# Patient Record
Sex: Male | Born: 1965 | Race: Black or African American | Hispanic: No | Marital: Single | Smoking: Never smoker
Health system: Southern US, Community
[De-identification: ages and names within clinical notes are randomized; demographics above are authoritative.]

## PROBLEM LIST (undated history)

## (undated) DIAGNOSIS — I10 Essential (primary) hypertension: Secondary | ICD-10-CM

## (undated) DIAGNOSIS — R569 Unspecified convulsions: Secondary | ICD-10-CM

## (undated) DIAGNOSIS — F849 Pervasive developmental disorder, unspecified: Secondary | ICD-10-CM

## (undated) DIAGNOSIS — K5900 Constipation, unspecified: Secondary | ICD-10-CM

## (undated) DIAGNOSIS — F809 Developmental disorder of speech and language, unspecified: Secondary | ICD-10-CM

## (undated) HISTORY — PX: DG TEETH FULL: HXRAD118

---

## 2011-09-30 HISTORY — PX: CATARACT EXTRACTION W/ INTRAOCULAR LENS IMPLANT: SHX1309

## 2014-06-15 ENCOUNTER — Emergency Department: Payer: Self-pay | Admitting: Emergency Medicine

## 2014-06-15 LAB — URINALYSIS, COMPLETE
BACTERIA: NONE SEEN
Bilirubin,UR: NEGATIVE
Blood: NEGATIVE
Glucose,UR: NEGATIVE mg/dL (ref 0–75)
Ketone: NEGATIVE
Leukocyte Esterase: NEGATIVE
NITRITE: NEGATIVE
Ph: 7 (ref 4.5–8.0)
Protein: NEGATIVE
RBC,UR: 3 /HPF (ref 0–5)
SPECIFIC GRAVITY: 1.014 (ref 1.003–1.030)
SQUAMOUS EPITHELIAL: NONE SEEN
WBC UR: 4 /HPF (ref 0–5)

## 2014-06-15 LAB — COMPREHENSIVE METABOLIC PANEL
AST: 10 U/L — AB (ref 15–37)
Albumin: 3.7 g/dL (ref 3.4–5.0)
Alkaline Phosphatase: 82 U/L
Anion Gap: 18 — ABNORMAL HIGH (ref 7–16)
BILIRUBIN TOTAL: 0.3 mg/dL (ref 0.2–1.0)
BUN: 17 mg/dL (ref 7–18)
CO2: 17 mmol/L — AB (ref 21–32)
Calcium, Total: 8.4 mg/dL — ABNORMAL LOW (ref 8.5–10.1)
Chloride: 104 mmol/L (ref 98–107)
Creatinine: 1.86 mg/dL — ABNORMAL HIGH (ref 0.60–1.30)
EGFR (African American): 49 — ABNORMAL LOW
GFR CALC NON AF AMER: 42 — AB
Glucose: 169 mg/dL — ABNORMAL HIGH (ref 65–99)
Osmolality: 283 (ref 275–301)
POTASSIUM: 4.2 mmol/L (ref 3.5–5.1)
SGPT (ALT): 21 U/L
Sodium: 139 mmol/L (ref 136–145)
TOTAL PROTEIN: 7.8 g/dL (ref 6.4–8.2)

## 2014-06-15 LAB — TROPONIN I

## 2014-06-15 LAB — CBC WITH DIFFERENTIAL/PLATELET
BASOS ABS: 0.1 10*3/uL (ref 0.0–0.1)
Basophil %: 0.6 %
Eosinophil #: 0.6 10*3/uL (ref 0.0–0.7)
Eosinophil %: 5.5 %
HCT: 43.9 % (ref 40.0–52.0)
HGB: 13.8 g/dL (ref 13.0–18.0)
Lymphocyte #: 2.9 10*3/uL (ref 1.0–3.6)
Lymphocyte %: 28.4 %
MCH: 28.7 pg (ref 26.0–34.0)
MCHC: 31.4 g/dL — AB (ref 32.0–36.0)
MCV: 91 fL (ref 80–100)
Monocyte #: 1 x10 3/mm (ref 0.2–1.0)
Monocyte %: 10.1 %
Neutrophil #: 5.7 10*3/uL (ref 1.4–6.5)
Neutrophil %: 55.4 %
Platelet: 168 10*3/uL (ref 150–440)
RBC: 4.81 10*6/uL (ref 4.40–5.90)
RDW: 14.5 % (ref 11.5–14.5)
WBC: 10.4 10*3/uL (ref 3.8–10.6)

## 2014-06-15 LAB — TSH: Thyroid Stimulating Horm: 5.82 u[IU]/mL — ABNORMAL HIGH

## 2014-06-15 LAB — LIPASE, BLOOD: LIPASE: 77 U/L (ref 73–393)

## 2014-11-21 ENCOUNTER — Ambulatory Visit: Payer: Self-pay | Admitting: Ophthalmology

## 2015-01-28 NOTE — Op Note (Signed)
PATIENT NAME:  Mario Mason, [name] MR#:  [id number] DATE OF BIRTH:  [birth year]  DATE OF PROCEDURE:  11/21/2014  PREOPERATIVE DIAGNOSIS: Mature cataract of the left eye.   POSTOPERATIVE DIAGNOSIS: Mature cataract of the left eye.  ANESTHESIA: General anesthesia by laryngeal mask.   SURGEON: Jerilee FieldWilliam L. Timberlyn Pickford, MD   COMPLICATIONS: None.   The patient was examined and consented in the preoperative holding area per his power of attorney, who is his sister. The patient was then premedicated in the preoperative holding area and taken back to the operating room, where the anesthesia team employed general anesthesia by laryngeal mask. Once the patient was asleep and comfortable, 10% phenylephrine was placed in the left eye to achieve rapid dilation. Following the AK-Dilate and compounded gel consisting of a gatifloxacin, phenylephrine, cyclopentolate, and lidocaine was placed on the eye in the usual topical fashion. After somewhat greater than 15 minutes, adequate dilation was achieved. The eye was prepped and draped in the usual sterile ophthalmic fashion and a lid speculum was placed. A paracentesis was created and epi-Shugarcaine was placed viscoelastic and the Tecnis ZCB00 sugar cannula was placed within the anterior chamber. A sterile air bubble was placed in the anterior chamber and vision blue dye was used to stain the anterior capsule. Viscoelastic was used to flush the vision blue and remaining epi-Shugarcaine from the anterior chamber. The keratome used to create a standard clear corneal incision. A continuous curvilinear capsulorrhexis was performed with a cystotome followed by the capsulorrhexis forceps. Hydrodissection was carried out with BSS on blunt cannula. The lens was removed in a stop and chop technique with phacoemulsification. Remaining cortical material was removed with the I and A handpiece. The capsular bag was inflated with DisCoVisc, and the Alcon SN60WF 20.0 diopter lens, serial number  [id number] was placed the capsular bag without complication. The remaining viscoelastic was removed from the eye. The wounds were hydrated. The eye was found to be watertight, 0.1 mL of cefuroxime was placed within the anterior chamber in the usual fashion. Additionally, 0.1 mL of Kenalog was placed beneath the conjunctiva as a steroid depot. Also, undiluted Vigamox was placed in a similar fashion as a conjunctival antibiotic depot. These were placed, as the patient will not tolerate any eyedrops postoperatively due to his severe cerebral palsy. The lid speculum was removed, Vigamox was placed topically on the eye, and a shield was placed over the eye. The patient was awakened from general anesthesia without complication. He will follow up with me in one day.    ____________________________ Jerilee FieldWilliam L. Chelsye Suhre, MD wlp:mw D: 11/21/2014 20:14:47 ET T: 11/22/2014 05:43:35 ET JOB#: [id number]  cc: Ahsley Attwood L. Neesha Langton, MD, <Dictator> Jerilee FieldWILLIAM L Marland Reine MD ELECTRONICALLY SIGNED 11/23/2014 10:31

## 2015-02-25 IMAGING — CT CT ABD-PELV W/O CM
2 of 4 series · 16 of 46 positions shown, 18 images · non-contrast
Comparison: Chest x-ray of today's date.

CLINICAL DATA: Vomiting and possible seizure activity

EXAM:
CT ABDOMEN AND PELVIS WITHOUT CONTRAST
TECHNIQUE: Multidetector CT imaging of the abdomen and pelvis was performed
following the standard protocol without IV contrast. The patient
could not tolerate oral contrast material.

[Series 2: routine abd pel without · axial · non-contrast · 0.68mm/px · z∈[-852,-418]mm · 13 of 95 slices shown, 15 images]
[im 4/95  soft-tissue]
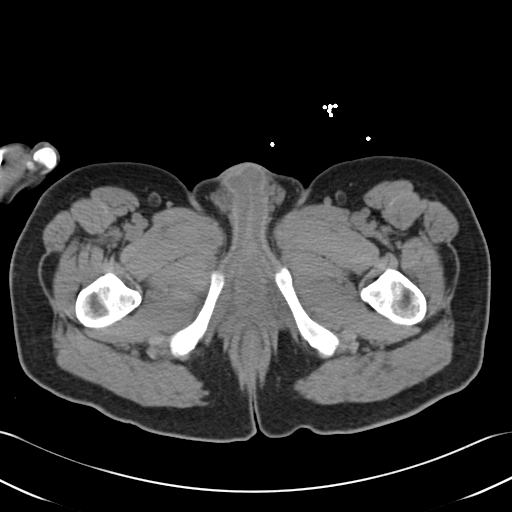
[im 4/95  bone]
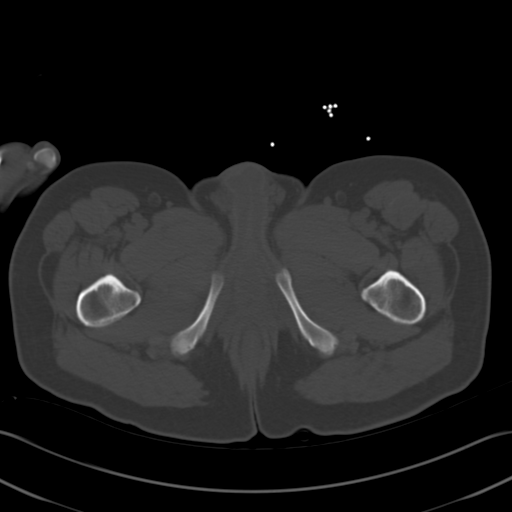
[im 11/95  soft-tissue]
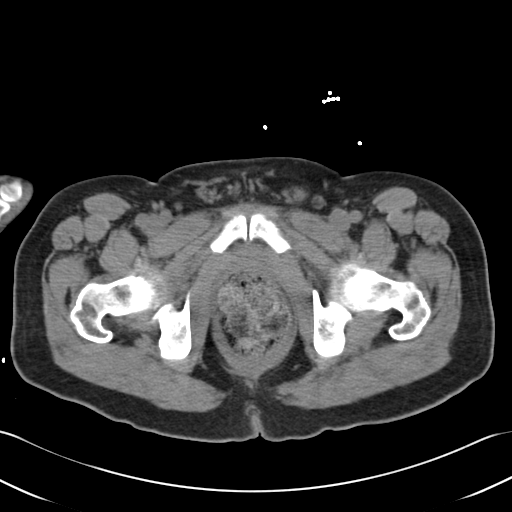
[im 19/95  soft-tissue]
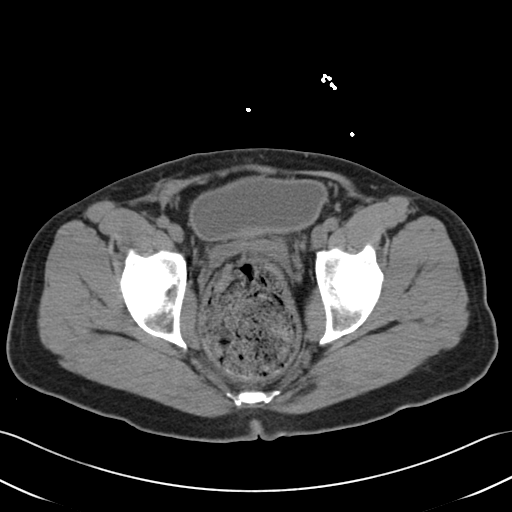
[im 26/95  soft-tissue]
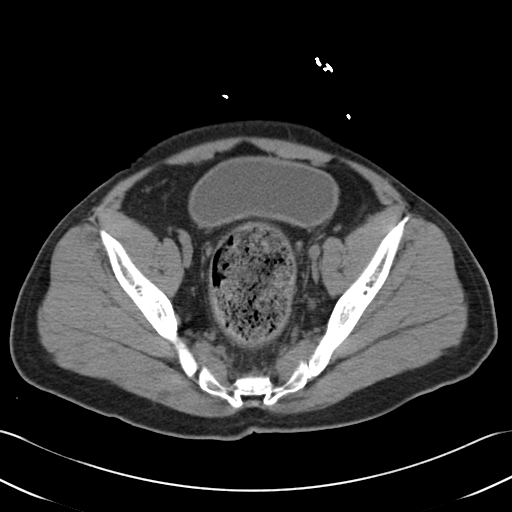
[im 33/95  soft-tissue]
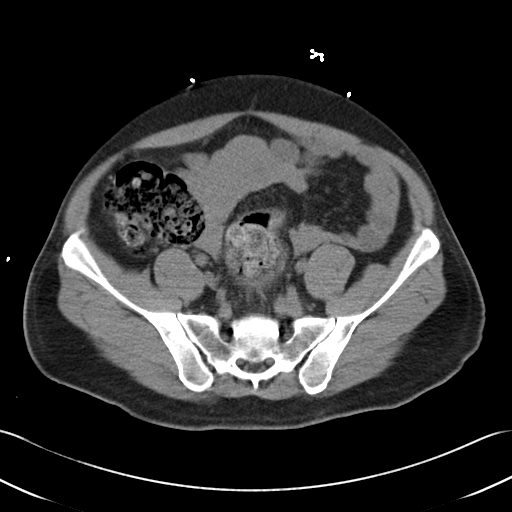
[im 40/95  soft-tissue]
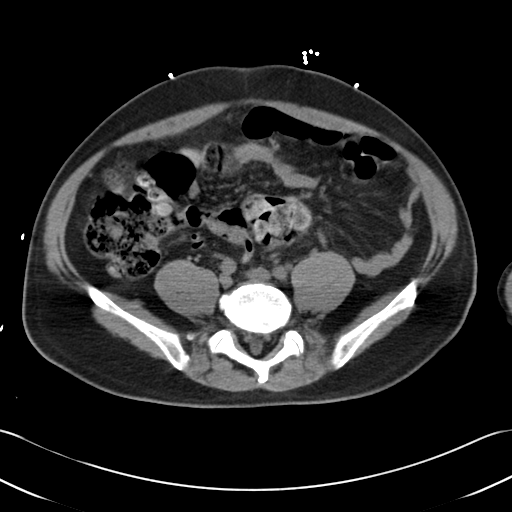
[im 48/95  soft-tissue]
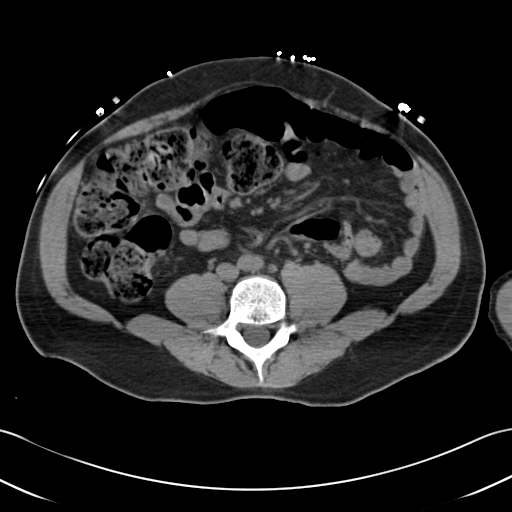
[im 55/95  soft-tissue]
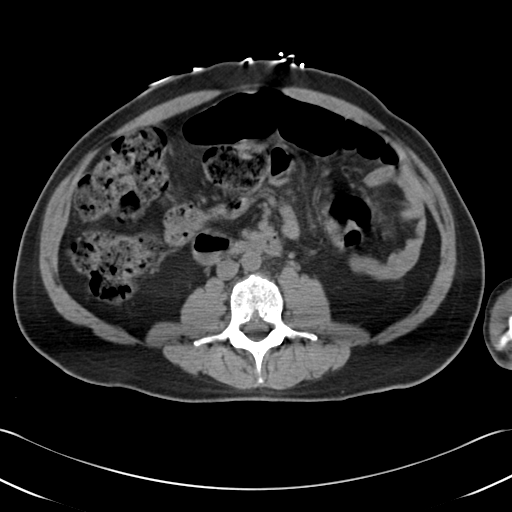
[im 62/95  soft-tissue]
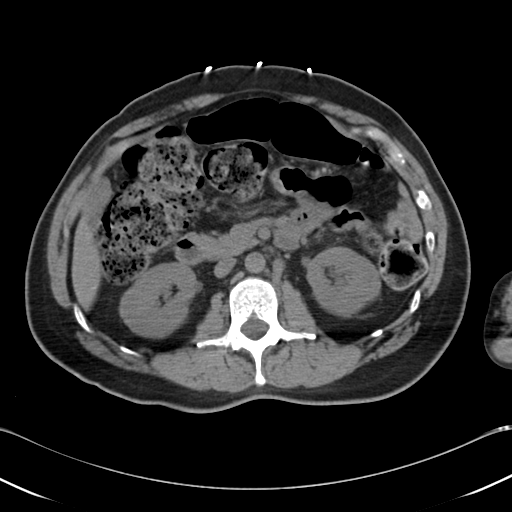
[im 62/95  bone]
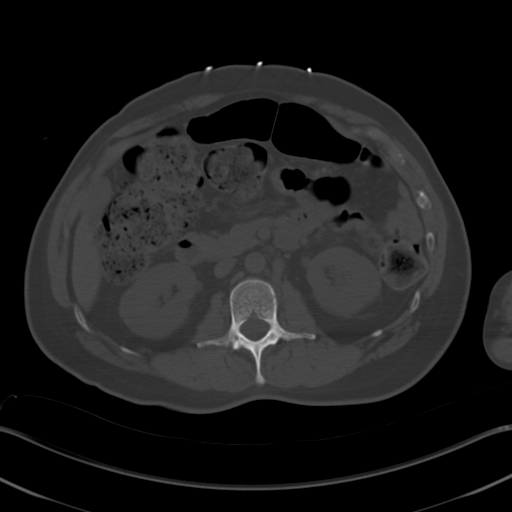
[im 69/95  soft-tissue]
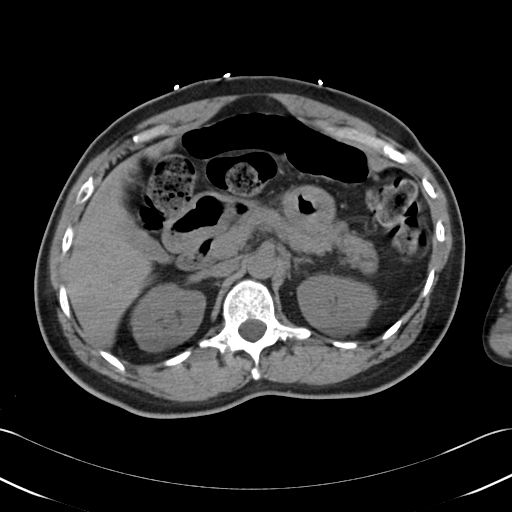
[im 76/95  soft-tissue]
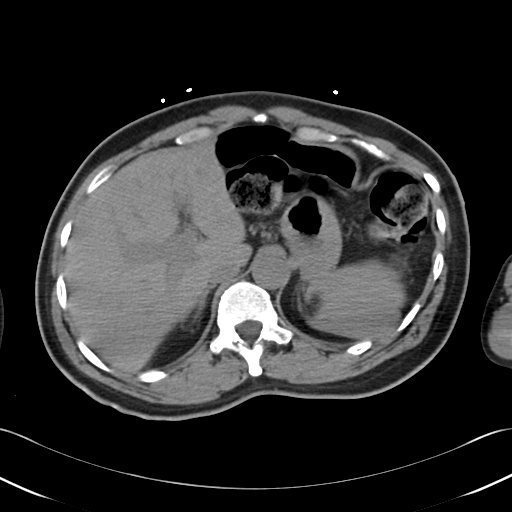
[im 84/95  soft-tissue]
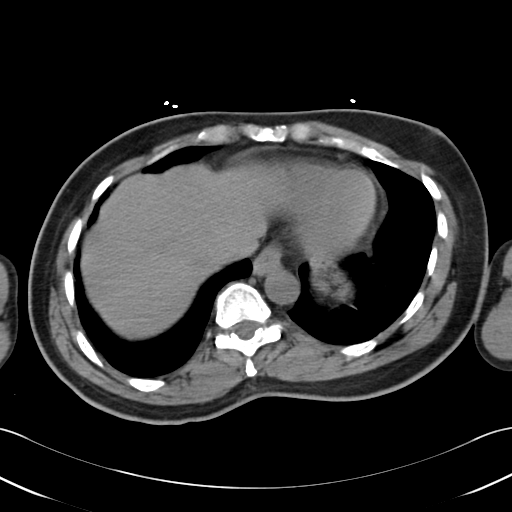
[im 91/95  soft-tissue]
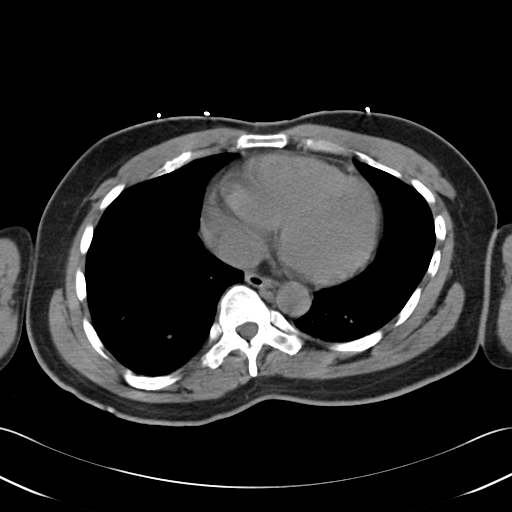

[Series 5: cor routine abd pel wo · coronal · 0.73mm/px · 3 of 119 slices shown]
[im 40/119  soft-tissue]
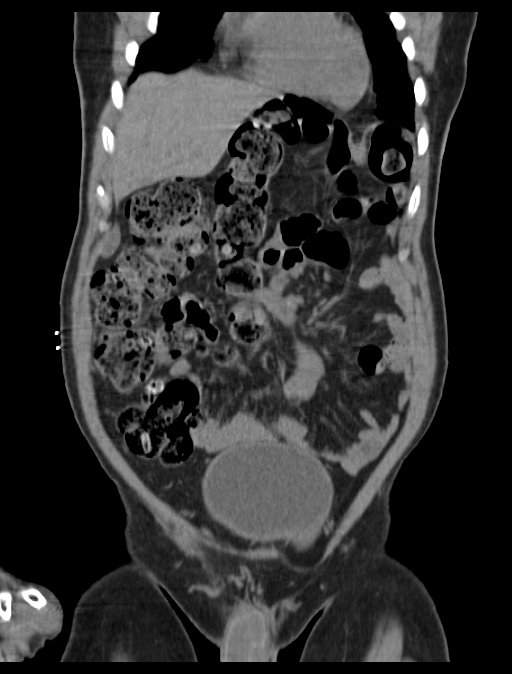
[im 53/119  soft-tissue]
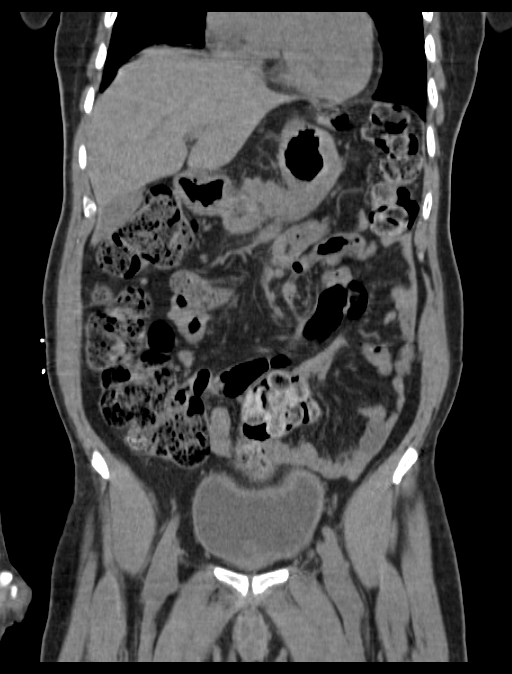
[im 66/119  soft-tissue]
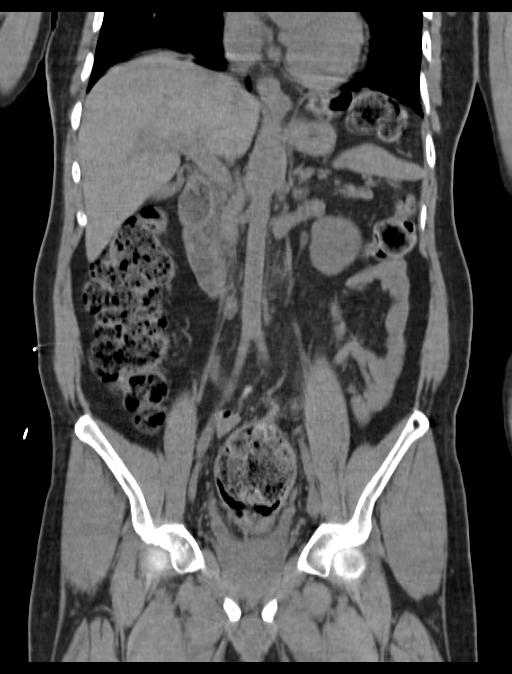

[16 of 46 positions shown; findings below may reference images not displayed]

FINDINGS: The liver, gallbladder, pancreas, spleen, adrenal glands, and
kidneys are normal. Small hepatic cysts are suspected. The left
hemidiaphragm does not appear abnormally elevated on this study. The
caliber of the abdominal aorta is normal. There is a normal
appearance of the stomach and small bowel. The appendix is normal.
There is increased stool burden within the colon. A large amount of
stool is present in the rectum consistent with an impaction. The
urinary bladder and prostate gland are grossly normal. There is no
ascites or lymphadenopathy.

The lung bases are clear. The lumbar spine and bony pelvis exhibit
no acute abnormalities.
IMPRESSION: 1. There is an increased stool burden throughout the colon
consistent with constipation. There is likely a fecal impaction
within the rectum. The small bowel is unremarkable.
2. There is no acute hepatobiliary nor urinary tract abnormality.

## 2016-08-09 ENCOUNTER — Emergency Department
Admission: EM | Admit: 2016-08-09 | Discharge: 2016-08-09 | Disposition: A | Discharge status: Home / Self Care | Payer: Medicare Other | Attending: Emergency Medicine | Admitting: Emergency Medicine

## 2016-08-09 DIAGNOSIS — Y929 Unspecified place or not applicable: Secondary | ICD-10-CM | POA: Insufficient documentation

## 2016-08-09 DIAGNOSIS — Y9389 Activity, other specified: Secondary | ICD-10-CM | POA: Insufficient documentation

## 2016-08-09 DIAGNOSIS — W01118A Fall on same level from slipping, tripping and stumbling with subsequent striking against other sharp object, initial encounter: Secondary | ICD-10-CM | POA: Insufficient documentation

## 2016-08-09 DIAGNOSIS — Y999 Unspecified external cause status: Secondary | ICD-10-CM | POA: Diagnosis not present

## 2016-08-09 DIAGNOSIS — S01111A Laceration without foreign body of right eyelid and periocular area, initial encounter: Secondary | ICD-10-CM | POA: Insufficient documentation

## 2016-08-09 DIAGNOSIS — S0181XA Laceration without foreign body of other part of head, initial encounter: Secondary | ICD-10-CM

## 2016-08-09 DIAGNOSIS — S0990XA Unspecified injury of head, initial encounter: Secondary | ICD-10-CM | POA: Diagnosis present

## 2016-08-09 NOTE — ED Provider Notes (Signed)
[facility] Emergency Department Provider Note  ____________________________________________  Time seen: Approximately 8:13 PM  I have reviewed the triage vital signs and the nursing notes.   HISTORY  Chief Complaint Laceration    HPI Mario Mason is a 50 y.o. male who presents to the emergency department accompanied by his group home care giver for evaluation for a laceration. At baseline patient is nonverbal and history was obtained from caregiver. He has a history of autism and is in a group home. The caregiver reports that earlier this evening the patient was attempting to get away from caregiver when he fell and hit his head on the door nob causing the laceration. She reports that previously, he was using the restroom when he soiled himself. Injury occurred while he was attempting to evade group home personnel. She denies any loss of consciousness, vomiting, or changes in behavior or activity level. Since injury, patient has been acting his normal self with no changes. He is up to date on tetanus.    No past medical history on file.  There are no active problems to display for this patient.   No past surgical history on file.  Prior to Admission medications   Not on File    Allergies Patient has no known allergies.  No family history on file.  Social History Social History  Substance Use Topics  . Smoking status: Not on file  . Smokeless tobacco: Not on file  . Alcohol use Not on file     Review of Systems  Eyes: No visual changes.  Gastrointestinal:  No nausea, no vomiting.  Musculoskeletal: Patient flinches with palpation of the forehead at area of laceration. Skin: Positive for laceration to right eyebrow.  Neurological: No loss of consciousness. Patient is at baseline for activity and behavior. 10-point ROS otherwise negative.  ____________________________________________   PHYSICAL EXAM:  VITAL SIGNS: ED Triage Vitals  Enc  Vitals Group     BP 08/09/16 1905 (!) 133/94     Pulse Rate 08/09/16 1905 82     Resp 08/09/16 1905 18     Temp 08/09/16 1905 98 F (36.7 C)     Temp Source 08/09/16 1905 Oral     SpO2 08/09/16 1905 94 %     Weight 08/09/16 1906 115 lb (52.2 kg)     Height 08/09/16 1906 5\' 11"  (1.803 m)     Head Circumference --      Peak Flow --      Pain Score --      Pain Loc --      Pain Edu? --      Excl. in GC? --      Constitutional: Alert and oriented. Well appearing and in no acute distress. Eyes: Conjunctivae are normal. PERRL. EOMI. Head: Laceration to right eyebrow See below for description of laceration. Patient otherwise does not withdraw to palpation of the osseous structures of the skull or face. No battle signs. No raccoon eyes. No serous fluid drainage from the ears or nares. Neck: No cervical spine tenderness to palpation. Cardiovascular: Normal rate, regular rhythm. Normal S1 and S2.  Good peripheral circulation. Respiratory: Normal respiratory effort without tachypnea or retractions. Lungs CTAB. Good air entry to the bases with no decreased or absent breath sounds. Musculoskeletal: Full range of motion to all extremities. No gross deformities appreciated. Neurologic:  Normal speech and language. No gross focal neurologic deficits are appreciated.  Skin: 1.5 cm superficial laceration to the lateral portion of the  right eyebrow. No foreign bodies noted. No erythema, ecchymosis, streaking, oozing, or active bleeding at this time.  Psychiatric: Mood and affect are normal. Speech and behavior are normal. Patient exhibits appropriate insight and judgement.   ____________________________________________   LABS (all labs ordered are listed, but only abnormal results are displayed)  Labs Reviewed - No data to display ____________________________________________  EKG  None ____________________________________________  RADIOLOGY  No results  found.  ____________________________________________    PROCEDURES  Procedure(s) performed:    Marland Kitchen.Marland Kitchen.Laceration Repair Date/Time: 08/09/2016 8:36 PM Performed by: Gala RomneyUTHRIELL, JONATHAN D Authorized by: Gala RomneyUTHRIELL, JONATHAN D   Consent:    Consent obtained:  Verbal   Consent given by:  Healthcare agent   Risks discussed:  Poor cosmetic result, pain and infection   Alternatives discussed:  No treatment Anesthesia (see MAR for exact dosages):    Anesthesia method:  None Laceration details:    Location:  Face   Face location:  R eyebrow   Length (cm):  1.5 Repair type:    Repair type:  Simple Exploration:    Hemostasis achieved with:  Direct pressure   Wound exploration: wound explored through full range of motion     Wound extent: no foreign bodies/material noted, no muscle damage noted and no underlying fracture noted   Treatment:    Area cleansed with:  Hibiclens   Amount of cleaning:  Standard Skin repair:    Repair method:  Tissue adhesive Approximation:    Approximation:  Close Post-procedure details:    Dressing:  Open (no dressing)   Patient tolerance of procedure:  Tolerated well, no immediate complications      Medications - No data to display   ____________________________________________   INITIAL IMPRESSION / ASSESSMENT AND PLAN / ED COURSE  Pertinent labs & imaging results that were available during my care of the patient were reviewed by me and considered in my medical decision making (see chart for details).  Review of the [place] CSRS was performed in accordance of the [facility] prior to dispensing any controlled drugs.  Clinical Course     Patient's diagnosis is consistent with laceration of the right eyebrow. Wound was cleaned and re-approximated with Dermabond. Patient is acting at his baseline with no loss of consciousness during the injury. At this time, there is no indication for imaging for acute intracranial abnormality. Patient does have close  follow-up with medical staff within the group home. They're given instructions to return for any sudden changes from patient's baseline. Patient will be discharged home with wound care instructions. Patient is to follow up with his primary care as needed or otherwise directed. Patient is given ED precautions to return to the ED for any worsening or new symptoms.     ____________________________________________  FINAL CLINICAL IMPRESSION(S) / ED DIAGNOSES  Final diagnoses:  Laceration of forehead, initial encounter      NEW MEDICATIONS STARTED DURING THIS VISIT:  There are no discharge medications for this patient.       This chart was dictated using voice recognition software/Dragon. Despite best efforts to proofread, errors can occur which can change the meaning. Any change was purely unintentional.   Racheal PatchesJonathan D Cuthriell, PA-C 08/09/16 2123    Jene Everyobert Kinner, MD 08/09/16 2153

## 2016-08-09 NOTE — ED Triage Notes (Signed)
Tripped and fell one hour ago, small lac above R brow

## 2016-08-09 NOTE — ED Notes (Signed)
Laceration to right brow, about 1 inch, bleeding controlled at this time.  Caregiver states pt did not lose consciousness when he fell.

## 2018-01-07 ENCOUNTER — Other Ambulatory Visit: Payer: Self-pay | Admitting: Gastroenterology

## 2018-01-07 DIAGNOSIS — R634 Abnormal weight loss: Secondary | ICD-10-CM

## 2018-01-07 DIAGNOSIS — R1310 Dysphagia, unspecified: Secondary | ICD-10-CM

## 2018-01-11 ENCOUNTER — Ambulatory Visit
Admission: RE | Admit: 2018-01-11 | Discharge: 2018-01-11 | Disposition: A | Discharge status: Home / Self Care | Payer: Medicare Other | Source: Ambulatory Visit | Attending: Gastroenterology | Admitting: Gastroenterology

## 2018-01-11 DIAGNOSIS — R1310 Dysphagia, unspecified: Secondary | ICD-10-CM | POA: Diagnosis present

## 2018-01-11 DIAGNOSIS — R634 Abnormal weight loss: Secondary | ICD-10-CM | POA: Insufficient documentation

## 2018-02-02 ENCOUNTER — Encounter: Payer: Self-pay | Admitting: *Deleted

## 2018-02-03 ENCOUNTER — Ambulatory Visit: Payer: Medicare Other | Admitting: Anesthesiology

## 2018-02-03 ENCOUNTER — Encounter
Admission: RE | Disposition: A | Discharge status: Home / Self Care | Payer: Self-pay | Source: Ambulatory Visit | Attending: Internal Medicine

## 2018-02-03 ENCOUNTER — Ambulatory Visit
Admission: RE | Admit: 2018-02-03 | Discharge: 2018-02-03 | Disposition: A | Discharge status: Home / Self Care | Payer: Medicare Other | Source: Ambulatory Visit | Attending: Internal Medicine | Admitting: Internal Medicine

## 2018-02-03 DIAGNOSIS — R1310 Dysphagia, unspecified: Secondary | ICD-10-CM | POA: Diagnosis present

## 2018-02-03 DIAGNOSIS — K449 Diaphragmatic hernia without obstruction or gangrene: Secondary | ICD-10-CM | POA: Diagnosis not present

## 2018-02-03 DIAGNOSIS — F79 Unspecified intellectual disabilities: Secondary | ICD-10-CM | POA: Diagnosis not present

## 2018-02-03 DIAGNOSIS — I10 Essential (primary) hypertension: Secondary | ICD-10-CM | POA: Diagnosis not present

## 2018-02-03 DIAGNOSIS — R634 Abnormal weight loss: Secondary | ICD-10-CM | POA: Diagnosis not present

## 2018-02-03 DIAGNOSIS — Z79899 Other long term (current) drug therapy: Secondary | ICD-10-CM | POA: Diagnosis not present

## 2018-02-03 DIAGNOSIS — F849 Pervasive developmental disorder, unspecified: Secondary | ICD-10-CM | POA: Diagnosis not present

## 2018-02-03 DIAGNOSIS — R569 Unspecified convulsions: Secondary | ICD-10-CM | POA: Insufficient documentation

## 2018-02-03 HISTORY — DX: Pervasive developmental disorder, unspecified: F84.9

## 2018-02-03 HISTORY — PX: ESOPHAGOGASTRODUODENOSCOPY (EGD) WITH PROPOFOL: SHX5813

## 2018-02-03 HISTORY — DX: Essential (primary) hypertension: I10

## 2018-02-03 HISTORY — DX: Unspecified convulsions: R56.9

## 2018-02-03 HISTORY — DX: Constipation, unspecified: K59.00

## 2018-02-03 HISTORY — DX: Developmental disorder of speech and language, unspecified: F80.9

## 2018-02-03 SURGERY — ESOPHAGOGASTRODUODENOSCOPY (EGD) WITH PROPOFOL
Anesthesia: General

## 2018-02-03 MED ORDER — PROPOFOL 500 MG/50ML IV EMUL
INTRAVENOUS | Status: DC | PRN
  Administered 2018-02-03: 100 ug/kg/min via INTRAVENOUS

## 2018-02-03 MED ORDER — PROPOFOL 500 MG/50ML IV EMUL
INTRAVENOUS | Status: AC
  Filled 2018-02-03: qty 50

## 2018-02-03 MED ORDER — GLYCOPYRROLATE 0.2 MG/ML IJ SOLN
INTRAMUSCULAR | Status: DC | PRN
  Administered 2018-02-03: 0.4 mg via INTRAVENOUS

## 2018-02-03 MED ORDER — SODIUM CHLORIDE 0.9 % IV SOLN
INTRAVENOUS | Status: DC
  Administered 2018-02-03: 09:00:00 via INTRAVENOUS

## 2018-02-03 MED ORDER — KETAMINE HCL 50 MG/ML IJ SOLN
INTRAMUSCULAR | Status: AC
  Filled 2018-02-03: qty 10

## 2018-02-03 MED ORDER — KETAMINE HCL 50 MG/ML IJ SOLN
INTRAMUSCULAR | Status: DC | PRN
  Administered 2018-02-03: 175 mg via INTRAMUSCULAR

## 2018-02-03 NOTE — Anesthesia Post-op Follow-up Note (Signed)
Anesthesia QCDR form completed.        

## 2018-02-03 NOTE — Transfer of Care (Signed)
Immediate Anesthesia Transfer of Care Note  Patient: Mario Mason  Procedure(s) Performed: ESOPHAGOGASTRODUODENOSCOPY (EGD) WITH PROPOFOL (N/A )  Patient Location: PACU and Endoscopy Unit  Anesthesia Type:General  Level of Consciousness: drowsy  Airway & Oxygen Therapy: Patient Spontanous Breathing  Post-op Assessment: Report given to RN  Post vital signs: stable  Last Vitals:  Vitals Value Taken Time  BP    Temp    Pulse    Resp    SpO2      Last Pain: There were no vitals filed for this visit.       Complications: No apparent anesthesia complications

## 2018-02-03 NOTE — Interval H&P Note (Signed)
History and Physical Interval Note:  02/03/2018 8:59 AM  Mario Mason  has presented today for surgery, with the diagnosis of DYSPHAGIA WT LOSS  The various methods of treatment have been discussed with the patient and family. After consideration of risks, benefits and other options for treatment, the patient has consented to  Procedure(s): ESOPHAGOGASTRODUODENOSCOPY (EGD) WITH PROPOFOL (N/A) as a surgical intervention .  The patient's history has been reviewed, patient examined, no change in status, stable for surgery.  I have reviewed the patient's chart and labs.  Questions were answered to the patient's satisfaction.     [place], [place]

## 2018-02-03 NOTE — H&P (Signed)
Outpatient short stay form Pre-procedure 02/03/2018 8:55 AM Sherwin Hollingshed K. Norma Fredrickson, M.D.  Primary Physician: Einar Crow, M.D.  Reason for visit:  Dysphagia, weight loss  History of present illness: 52 year old male with a history of mental retardation is nonverbal.  He presented to Tawni Pummel, PA-C on 01/11/2018 with complaints of dysphagia by the caregiver.  He has had approximate 20 pound weight loss over the last few months.  Barium swallow done preprocedurally revealed no gross stricture ulceration or mass in the esophagus although the examination was limited due to patient agitation and motion.  No obvious reflux was noted nor was a hiatal hernia noted.    Current Facility-Administered Medications:  .  0.9 %  sodium chloride infusion, , Intravenous, Continuous, Sabryn Preslar, Boykin Nearing, MD  Medications Prior to Admission  Medication Sig Dispense Refill Last Dose  . Acetaminophen 325 MG CAPS Take 650 mg by mouth daily.     Marland Kitchen amantadine (SYMMETREL) 100 MG capsule Take 100 mg by mouth 2 (two) times daily.     . fluticasone (FLONASE) 50 MCG/ACT nasal spray Place 2 sprays into both nostrils as needed for allergies or rhinitis.     Marland Kitchen glycopyrrolate (ROBINUL) 1 MG tablet Take 1 mg by mouth 3 (three) times daily.     . mirtazapine (REMERON) 30 MG tablet Take 30 mg by mouth at bedtime.     . OXcarbazepine (TRILEPTAL) 300 MG/5ML suspension Take 300 mg by mouth 2 (two) times daily.     . polyethylene glycol (MIRALAX / GLYCOLAX) packet Take 17 g by mouth daily.        No Known Allergies   Past Medical History:  Diagnosis Date  . Constipation   . Hypertension   . 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 Seizures (HCC)     Review of systems:      Physical Exam  Gen: Alert, oriented. Appears stated age.  HEENT: [place]/AT. PERRLA. Lungs: CTA, no wheezes. CV: RR nl S1, S2. Abd: soft, benign, no masses. BS+ Ext: No edema. Pulses 2+    Planned  procedures: EGD.The patient's guardian understands the nature of the planned procedure, indications, risks, alternatives and potential complications including but not limited to bleeding, infection, perforation, damage to internal organs and possible oversedation/side effects from anesthesia. The responsible party agrees and gives consent to proceed.  Please refer to procedure notes for findings, recommendations and patient disposition/instructions.    Telena Peyser K. Norma Fredrickson, M.D. Gastroenterology 02/03/2018  8:55 AM

## 2018-02-03 NOTE — OR Nursing (Signed)
Dr. Pernell Dupre has spoken with pty and his sister, pt will have IV started in procedure room.

## 2018-02-03 NOTE — Anesthesia Preprocedure Evaluation (Signed)
Anesthesia Evaluation  Patient identified by MRN, date of birth, ID band Patient awake    Reviewed: Allergy & Precautions, H&P , NPO status , Patient's Chart, lab work & pertinent test results, reviewed documented beta blocker date and time   Airway Mallampati: II   Neck ROM: full    Dental  (+) Poor Dentition, Teeth Intact   Pulmonary neg pulmonary ROS,    Pulmonary exam normal        Cardiovascular Exercise Tolerance: Good hypertension, On Medications negative cardio ROS Normal cardiovascular exam Rhythm:regular Rate:Normal     Neuro/Psych Seizures -, Well Controlled,  negative neurological ROS  negative psych ROS   GI/Hepatic negative GI ROS, Neg liver ROS,   Endo/Other  negative endocrine ROS  Renal/GU negative Renal ROS  negative genitourinary   Musculoskeletal   Abdominal   Peds  Hematology negative hematology ROS (+)   Anesthesia Other Findings Past Medical History: No date: Constipation No date: Hypertension No date: Intellectual disability with language impairment and  autistic features No date: Seizures [facility]) Past Surgical History: 2013: CATARACT EXTRACTION W/ INTRAOCULAR LENS IMPLANT     Comment:  both eyes done at different times No date: DG TEETH FULL     Comment:  at [facility]   Reproductive/Obstetrics negative OB ROS                             Anesthesia Physical Anesthesia Plan  ASA: III  Anesthesia Plan: General   Post-op Pain Management:    Induction:   PONV Risk Score and Plan:   Airway Management Planned:   Additional Equipment:   Intra-op Plan:   Post-operative Plan:   Informed Consent: I have reviewed the patients History and Physical, chart, labs and discussed the procedure including the risks, benefits and alternatives for the proposed anesthesia with the patient or authorized representative who has indicated his/her understanding and  acceptance.   Dental Advisory Given  Plan Discussed with: CRNA  Anesthesia Plan Comments:         Anesthesia Quick Evaluation

## 2018-02-03 NOTE — Anesthesia Postprocedure Evaluation (Signed)
Anesthesia Post Note  Patient: Mario Mason  Procedure(s) Performed: ESOPHAGOGASTRODUODENOSCOPY (EGD) WITH PROPOFOL (N/A )  Patient location during evaluation: PACU Anesthesia Type: General Level of consciousness: awake and alert Pain management: pain level controlled Vital Signs Assessment: post-procedure vital signs reviewed and stable Respiratory status: spontaneous breathing, nonlabored ventilation, respiratory function stable and patient connected to nasal cannula oxygen Cardiovascular status: blood pressure returned to baseline and stable Postop Assessment: no apparent nausea or vomiting Anesthetic complications: no     Last Vitals:  Vitals:   02/03/18 1003 02/03/18 1013  BP: (!) 139/104 (!) 125/93  Pulse: (!) 120 (!) 125  Resp: (!) 23 18  Temp:    SpO2: 98% 98%    Last Pain:  Vitals:   02/03/18 0943  TempSrc: Tympanic                 Yevette Edwards

## 2018-02-03 NOTE — Op Note (Signed)
[facility] Gastroenterology Patient Name: Mario Mason Procedure Date: 02/03/2018 9:05 AM MRN: [id number] Account #: [id number] Date of Birth: 08-Mar-1966 Admit Type: Outpatient Age: 52 Room: [facility] ENDO ROOM 4 Gender: Male Note Status: Finalized Procedure:            Upper GI endoscopy Indications:          Dysphagia Providers:            Boykin Nearing. Norma Fredrickson MD, MD Referring MD:         Marya Amsler. Dareen Piano MD, MD (Referring MD) Medicines:            Propofol per Anesthesia Complications:        No immediate complications. Procedure:            Pre-Anesthesia Assessment:                       - The risks and benefits of the procedure and the                        sedation options and risks were discussed with the                        patient. All questions were answered and informed                        consent was obtained.                       - Patient identification and proposed procedure were                        verified prior to the procedure by the nurse. The                        procedure was verified in the procedure room.                       - ASA Grade Assessment: III - A patient with severe                        systemic disease.                       - After reviewing the risks and benefits, the patient                        was deemed in satisfactory condition to undergo the                        procedure.                       After obtaining informed consent, the endoscope was                        passed under direct vision. Throughout the procedure,                        the patient's blood pressure, pulse, and oxygen  saturations were monitored continuously. The Endoscope                        was introduced through the mouth, and advanced to the                        third part of duodenum. The upper GI endoscopy was                        accomplished without difficulty. The patient tolerated                the procedure well. Findings:      No endoscopic abnormality was evident in the esophagus to explain the       patient's complaint of dysphagia.      There is no endoscopic evidence of stenosis, stricture, ulcerations or       mass in the entire esophagus.      A 2 cm hiatal hernia was present.      The exam was otherwise without abnormality.      The examined duodenum was normal. Impression:           - No endoscopic esophageal abnormality to explain                        patient's dysphagia.                       - 2 cm hiatal hernia.                       - The examination was otherwise normal.                       - Normal examined duodenum.                       - No specimens collected. Recommendation:       - Patient has a contact number available for                        emergencies. The signs and symptoms of potential                        delayed complications were discussed with the patient.                        Return to normal activities tomorrow. Written discharge                        instructions were provided to the patient.                       - Resume previous diet.                       - Continue present medications.                       - Return to GI office PRN.                       - The findings and recommendations were discussed with  the patient's family.                       - The findings and recommendations were discussed with                        the designated responsible adult. Procedure Code(s):    --- Professional ---                       [phone], Esophagogastroduodenoscopy, flexible, transoral;                        diagnostic, including collection of specimen(s) by                        brushing or washing, when performed (separate procedure) Diagnosis Code(s):    --- Professional ---                       K44.9, Diaphragmatic hernia without obstruction or                        gangrene                        R13.10, Dysphagia, unspecified CPT copyright 2017 [employer]. All rights reserved. The codes documented in this report are preliminary and upon coder review may  be revised to meet current compliance requirements. Stanton Kidney MD, MD 02/03/2018 9:35:43 AM This report has been signed electronically. Number of Addenda: 0 Note Initiated On: 02/03/2018 9:05 AM      [facility]

## 2018-02-03 NOTE — OR Nursing (Signed)
Pt's sister and POA is with pt.  Pt is non-verbal.

## 2018-02-04 ENCOUNTER — Encounter: Payer: Self-pay | Admitting: Internal Medicine

## 2022-03-26 ENCOUNTER — Emergency Department: Payer: Medicare Other

## 2022-03-26 ENCOUNTER — Inpatient Hospital Stay
Admission: EM | Admit: 2022-03-26 | Discharge: 2022-04-02 | DRG: 640 | Disposition: A | Discharge status: Skilled Nursing Facility | Payer: Medicare Other | Source: Skilled Nursing Facility | Attending: Internal Medicine | Admitting: Internal Medicine

## 2022-03-26 ENCOUNTER — Observation Stay: Payer: Medicare Other

## 2022-03-26 ENCOUNTER — Other Ambulatory Visit: Payer: Self-pay

## 2022-03-26 DIAGNOSIS — R1310 Dysphagia, unspecified: Secondary | ICD-10-CM | POA: Diagnosis present

## 2022-03-26 DIAGNOSIS — R55 Syncope and collapse: Secondary | ICD-10-CM | POA: Diagnosis present

## 2022-03-26 DIAGNOSIS — I10 Essential (primary) hypertension: Secondary | ICD-10-CM | POA: Diagnosis present

## 2022-03-26 DIAGNOSIS — Z20822 Contact with and (suspected) exposure to covid-19: Secondary | ICD-10-CM | POA: Diagnosis present

## 2022-03-26 DIAGNOSIS — R4701 Aphasia: Secondary | ICD-10-CM | POA: Diagnosis present

## 2022-03-26 DIAGNOSIS — E639 Nutritional deficiency, unspecified: Secondary | ICD-10-CM | POA: Diagnosis not present

## 2022-03-26 DIAGNOSIS — R6339 Other feeding difficulties: Secondary | ICD-10-CM | POA: Diagnosis present

## 2022-03-26 DIAGNOSIS — R634 Abnormal weight loss: Secondary | ICD-10-CM | POA: Diagnosis present

## 2022-03-26 DIAGNOSIS — E870 Hyperosmolality and hypernatremia: Secondary | ICD-10-CM

## 2022-03-26 DIAGNOSIS — Z79899 Other long term (current) drug therapy: Secondary | ICD-10-CM

## 2022-03-26 DIAGNOSIS — F509 Eating disorder, unspecified: Secondary | ICD-10-CM | POA: Diagnosis present

## 2022-03-26 DIAGNOSIS — F819 Developmental disorder of scholastic skills, unspecified: Secondary | ICD-10-CM | POA: Diagnosis present

## 2022-03-26 DIAGNOSIS — F840 Autistic disorder: Secondary | ICD-10-CM | POA: Diagnosis not present

## 2022-03-26 DIAGNOSIS — N179 Acute kidney failure, unspecified: Secondary | ICD-10-CM | POA: Diagnosis present

## 2022-03-26 DIAGNOSIS — G40909 Epilepsy, unspecified, not intractable, without status epilepticus: Secondary | ICD-10-CM

## 2022-03-26 DIAGNOSIS — D696 Thrombocytopenia, unspecified: Secondary | ICD-10-CM

## 2022-03-26 DIAGNOSIS — Z681 Body mass index (BMI) 19 or less, adult: Secondary | ICD-10-CM

## 2022-03-26 DIAGNOSIS — E43 Unspecified severe protein-calorie malnutrition: Secondary | ICD-10-CM | POA: Diagnosis present

## 2022-03-26 DIAGNOSIS — R000 Tachycardia, unspecified: Secondary | ICD-10-CM | POA: Diagnosis present

## 2022-03-26 DIAGNOSIS — E860 Dehydration: Principal | ICD-10-CM | POA: Diagnosis present

## 2022-03-26 DIAGNOSIS — F79 Unspecified intellectual disabilities: Secondary | ICD-10-CM

## 2022-03-26 DIAGNOSIS — E440 Moderate protein-calorie malnutrition: Secondary | ICD-10-CM | POA: Insufficient documentation

## 2022-03-26 LAB — CBC WITH DIFFERENTIAL/PLATELET
Abs Immature Granulocytes: 0.01 10*3/uL (ref 0.00–0.07)
Basophils Absolute: 0 10*3/uL (ref 0.0–0.1)
Basophils Relative: 0 %
Eosinophils Absolute: 0.2 10*3/uL (ref 0.0–0.5)
Eosinophils Relative: 3 %
HCT: 51.3 % (ref 39.0–52.0)
Hemoglobin: 15.7 g/dL (ref 13.0–17.0)
Immature Granulocytes: 0 %
Lymphocytes Relative: 24 %
Lymphs Abs: 1.6 10*3/uL (ref 0.7–4.0)
MCH: 27.4 pg (ref 26.0–34.0)
MCHC: 30.6 g/dL (ref 30.0–36.0)
MCV: 89.7 fL (ref 80.0–100.0)
Monocytes Absolute: 0.5 10*3/uL (ref 0.1–1.0)
Monocytes Relative: 7 %
Neutro Abs: 4.4 10*3/uL (ref 1.7–7.7)
Neutrophils Relative %: 66 %
Platelets: 107 10*3/uL — ABNORMAL LOW (ref 150–400)
RBC: 5.72 MIL/uL (ref 4.22–5.81)
RDW: 16.7 % — ABNORMAL HIGH (ref 11.5–15.5)
WBC: 6.7 10*3/uL (ref 4.0–10.5)
nRBC: 0 % (ref 0.0–0.2)

## 2022-03-26 LAB — COMPREHENSIVE METABOLIC PANEL
ALT: 54 U/L — ABNORMAL HIGH (ref 0–44)
AST: 36 U/L (ref 15–41)
Albumin: 4.2 g/dL (ref 3.5–5.0)
Alkaline Phosphatase: 62 U/L (ref 38–126)
Anion gap: 11 (ref 5–15)
BUN: 51 mg/dL — ABNORMAL HIGH (ref 6–20)
CO2: 27 mmol/L (ref 22–32)
Calcium: 9.3 mg/dL (ref 8.9–10.3)
Chloride: 116 mmol/L — ABNORMAL HIGH (ref 98–111)
Creatinine, Ser: 1.88 mg/dL — ABNORMAL HIGH (ref 0.61–1.24)
GFR, Estimated: 41 mL/min — ABNORMAL LOW (ref >60)
Glucose, Bld: 186 mg/dL — ABNORMAL HIGH (ref 70–99)
Potassium: 4.3 mmol/L (ref 3.5–5.1)
Sodium: 154 mmol/L — ABNORMAL HIGH (ref 135–145)
Total Bilirubin: 0.6 mg/dL (ref 0.3–1.2)
Total Protein: 8.5 g/dL — ABNORMAL HIGH (ref 6.5–8.1)

## 2022-03-26 LAB — D-DIMER, QUANTITATIVE: D-Dimer, Quant: 0.99 ug/mL-FEU — ABNORMAL HIGH (ref 0.00–0.50)

## 2022-03-26 LAB — TROPONIN I (HIGH SENSITIVITY): Troponin I (High Sensitivity): 10 ng/L (ref <18)

## 2022-03-26 MED ORDER — ZIPRASIDONE MESYLATE 20 MG IM SOLR
20.0000 mg | Freq: Once | INTRAMUSCULAR | Status: AC
  Administered 2022-03-26: 20 mg via INTRAMUSCULAR
  Filled 2022-03-26: qty 20

## 2022-03-26 MED ORDER — AMANTADINE HCL 100 MG PO CAPS
100.0000 mg | ORAL_CAPSULE | Freq: Two times a day (BID) | ORAL | Status: DC
  Administered 2022-03-26 – 2022-04-02 (×11): 100 mg via ORAL
  Filled 2022-03-26 (×16): qty 1

## 2022-03-26 MED ORDER — LACTATED RINGERS IV SOLN: INTRAVENOUS | Status: DC

## 2022-03-26 MED ORDER — LORAZEPAM 2 MG/ML IJ SOLN
2.0000 mg | INTRAMUSCULAR | Status: DC | PRN
Start: 2022-03-26 — End: 2022-04-03

## 2022-03-26 MED ORDER — ONDANSETRON HCL 4 MG/2ML IJ SOLN: 4.0000 mg | Freq: Four times a day (QID) | INTRAMUSCULAR | Status: DC | PRN

## 2022-03-26 MED ORDER — OXCARBAZEPINE 300 MG/5ML PO SUSP
300.0000 mg | Freq: Two times a day (BID) | ORAL | Status: DC
  Administered 2022-03-29 – 2022-04-02 (×9): 300 mg via ORAL
  Filled 2022-03-26 (×15): qty 5

## 2022-03-26 MED ORDER — LORAZEPAM 2 MG/ML IJ SOLN: 1.0000 mg | Freq: Four times a day (QID) | INTRAMUSCULAR | Status: DC | PRN

## 2022-03-26 MED ORDER — IOHEXOL 350 MG/ML SOLN
50.0000 mL | Freq: Once | INTRAVENOUS | Status: AC | PRN
  Administered 2022-03-26: 50 mL via INTRAVENOUS

## 2022-03-26 MED ORDER — AZELASTINE HCL 0.1 % NA SOLN
2.0000 | Freq: Two times a day (BID) | NASAL | Status: DC
  Administered 2022-03-26 – 2022-04-02 (×7): 2 via NASAL
  Filled 2022-03-26: qty 30

## 2022-03-26 MED ORDER — POLYETHYLENE GLYCOL 3350 17 G PO PACK: 17.0000 g | PACK | Freq: Every day | ORAL | Status: DC | PRN

## 2022-03-26 MED ORDER — MONTELUKAST SODIUM 10 MG PO TABS
10.0000 mg | ORAL_TABLET | Freq: Every day | ORAL | Status: DC
  Administered 2022-03-26 – 2022-04-02 (×7): 10 mg via ORAL
  Filled 2022-03-26 (×8): qty 1

## 2022-03-26 MED ORDER — FAMOTIDINE 20 MG PO TABS
20.0000 mg | ORAL_TABLET | Freq: Every day | ORAL | Status: DC
  Administered 2022-03-28 – 2022-04-02 (×6): 20 mg via ORAL
  Filled 2022-03-26 (×7): qty 1

## 2022-03-26 MED ORDER — HALOPERIDOL LACTATE 5 MG/ML IJ SOLN
2.0000 mg | Freq: Four times a day (QID) | INTRAMUSCULAR | Status: DC | PRN
  Administered 2022-03-26 – 2022-03-27 (×2): 2 mg via INTRAMUSCULAR
  Filled 2022-03-26 (×2): qty 1

## 2022-03-26 MED ORDER — AMOXICILLIN 250 MG/5ML PO SUSR
800.0000 mg | Freq: Two times a day (BID) | ORAL | Status: DC
  Filled 2022-03-26 (×6): qty 20

## 2022-03-26 MED ORDER — LACTATED RINGERS IV BOLUS: 1000.0000 mL | Freq: Once | INTRAVENOUS | Status: DC

## 2022-03-26 MED ORDER — ONDANSETRON HCL 4 MG PO TABS: 4.0000 mg | ORAL_TABLET | Freq: Four times a day (QID) | ORAL | Status: DC | PRN

## 2022-03-26 MED ORDER — FLUTICASONE PROPIONATE 50 MCG/ACT NA SUSP: 2.0000 | NASAL | Status: DC | PRN

## 2022-03-26 MED ORDER — ENOXAPARIN SODIUM 40 MG/0.4ML IJ SOSY
40.0000 mg | PREFILLED_SYRINGE | INTRAMUSCULAR | Status: DC
  Administered 2022-03-26 – 2022-03-30 (×2): 40 mg via SUBCUTANEOUS
  Filled 2022-03-26 (×8): qty 0.4

## 2022-03-26 MED ORDER — MIRTAZAPINE 15 MG PO TABS
30.0000 mg | ORAL_TABLET | Freq: Every day | ORAL | Status: DC
  Administered 2022-03-26 – 2022-04-01 (×5): 30 mg via ORAL
  Filled 2022-03-26 (×8): qty 2

## 2022-03-26 MED ORDER — SENNOSIDES-DOCUSATE SODIUM 8.6-50 MG PO TABS: 1.0000 | ORAL_TABLET | Freq: Every evening | ORAL | Status: DC | PRN

## 2022-03-26 MED ORDER — ACETAMINOPHEN 325 MG PO TABS: 650.0000 mg | ORAL_TABLET | Freq: Four times a day (QID) | ORAL | Status: AC | PRN

## 2022-03-26 MED ORDER — ACETAMINOPHEN 650 MG RE SUPP: 650.0000 mg | Freq: Four times a day (QID) | RECTAL | Status: AC | PRN

## 2022-03-26 MED ORDER — MOMETASONE FURO-FORMOTEROL FUM 100-5 MCG/ACT IN AERO
2.0000 | INHALATION_SPRAY | Freq: Two times a day (BID) | RESPIRATORY_TRACT | Status: DC
  Administered 2022-03-26 – 2022-04-02 (×9): 2 via RESPIRATORY_TRACT
  Filled 2022-03-26: qty 8.8

## 2022-03-26 NOTE — Assessment & Plan Note (Addendum)
-   Etiology work-up in progress at this time, differentials include PE, heart failure, hypovolemia secondary to poor p.o. intake - Continue to follow high sensitive troponin, check procalcitonin - CTA of the chest to assess for PE has been ordered due to mildly elevated D-dimer with sinus tachycardia and syncope - Check procalcitonin, TSH, CT of the head without contrast, follow-up on second high sensitive troponin - If CT is negative we will also order complete echo

## 2022-03-26 NOTE — H&P (Signed)
History and Physical   [name] PJK:[id number] DOB: [birth year] DOA: 03/26/2022  PCP: Mario Regulus, MD  Outpatient Specialists: Dr. [facility] Patient coming from: [facility]  I have personally briefly reviewed patient's old medical records in [facility] EMR.  Chief Concern: Syncope  HPI: [name] is a 56 year old male with history of learning disability, autism, nonverbal at baseline, seizure disorder, who presents emergency department from [facility] for chief concerns of syncopal events.  Initial vitals in the emergency department showed temperature of 97.8, respiration rate of 20, heart rate of 134, blood pressure 138/110, SPO2 of 99% on room air.  Serum sodium is 154, potassium 4.3, chloride 116, bicarb 27, BUN of 51, serum creatinine of 1.88, GFR 41, nonfasting blood glucose 186, WBC 6.7, hemoglobin 15.7, platelets of 1007, high sensitive troponin is 10.  ED treatment: LR 1 L bolus, Geodon 20 mg injection IM.  At bedside patient is nonverbal and sleeping.  He does not appear to be in acute distress.  He is protecting his airway.  [name], who is patient's caregiver from [facility] group home.  Mario Mason states that patient has had poor p.o. intake over the last 2 months and over the last 2 weeks has refused to eat.  They have to try to assist him with drinking however he minimally drinks water and/or eat his food.  He does take his medication regularly.  Caregiver denies diarrhea, fever, nausea, vomiting.  Per caregiver, patient lost consciousness for approximately 30 seconds.  There was no reported seizure activity.    Caregiver states that he has difficulty swallowing and what he believes is food is stuck in patient's throat therefore patient is not eating.  Social history: He lives at a group home.  Caregiver denies tobacco, EtOH, recreational drug use.  ROS: Constitutional: no weight change, no  fever ENT/Mouth: no sore throat, no rhinorrhea Eyes: no eye pain, no vision changes Cardiovascular: no chest pain, no dyspnea,  no edema, no palpitations Respiratory: no cough, no sputum, no wheezing Gastrointestinal: no nausea, no vomiting, no diarrhea, no constipation Genitourinary: no urinary incontinence, no dysuria, no hematuria Musculoskeletal: no arthralgias, no myalgias Skin: no skin lesions, no pruritus, Neuro: + weakness, no loss of consciousness, no syncope Psych: no anxiety, no depression, + decrease appetite Heme/Lymph: no bruising, no bleeding  ED Course: Discussed with emergency medicine provider, patient requiring hospitalization for chief concerns of syncope and sinus tachycardia.  Assessment/Plan  Principal Problem:   Syncope Active Problems:   Nonverbal   Autism   Sinus tachycardia   Weight loss   Nutritional deficiency due to eating disorder   Refuses to eat   Protein-calorie malnutrition, severe (HCC)   Seizure disorder (HCC)   Assessment and Plan:  * Syncope - Etiology work-up in progress at this time, differentials include PE, heart failure, hypovolemia secondary to poor p.o. intake - Continue to follow high sensitive troponin, check procalcitonin - CTA of the chest to assess for PE has been ordered due to mildly elevated D-dimer with sinus tachycardia and syncope - Check procalcitonin, TSH, CT of the head without contrast, follow-up on second high sensitive troponin - If CT is negative we will also order complete echo  Seizure disorder [facility]) - Per neurology note on 10/22/2021  Protein-calorie malnutrition, severe (HCC) - Present on admission, dietary has been consulted  Refuses to eat - Dietary has been consulted - Status post LR 1 L bolus per  EDP; I have ordered LR infusion at 125 mL/h, 1 day ordered - Cervical stat x-ray has been ordered to assess for overt blockage or stenosis - Patient may benefit from barium swallow if he will take the  barium, I will defer this to primary attending team in the a.m.  Weight loss - Secondary to poor p.o. intake - The solution to this may be multifactorial including psychiatric evaluation and dietitian involvement - Behavioral health service and dietary have been consulted  Sinus tachycardia - Work-up in progress per above  Autism With learning disability  Chart reviewed.   DVT prophylaxis: Enoxaparin Code Status: Full code Diet: Regular diet Family Communication: No, I discussed with caregiver from [facility] at bedside Disposition Plan: Pending clinical course Consults called: Behavioral health and dietary Admission status: Telemetry medical, observe  Past Medical History:  Diagnosis Date   Constipation    Hypertension    Intellectual disability with language impairment and autistic features    Seizures (HCC)    Past Surgical History:  Procedure Laterality Date   CATARACT EXTRACTION W/ INTRAOCULAR LENS IMPLANT  2013   both eyes done at different times   DG TEETH FULL     at [facility]   ESOPHAGOGASTRODUODENOSCOPY (EGD) WITH PROPOFOL N/A 02/03/2018   Procedure: ESOPHAGOGASTRODUODENOSCOPY (EGD) WITH PROPOFOL;  Surgeon: Toledo, Boykin Nearing, MD;  Location: [facility] ENDOSCOPY;  Service: Gastroenterology;  Laterality: N/A;   Social History:  reports that he has never smoked. He has never used smokeless tobacco. He reports that he does not drink alcohol and does not use drugs.  No Known Allergies No family history on file. Family history: Unknown family history.  Prior to Admission medications   Medication Sig Start Date End Date Taking? Authorizing Provider  Acetaminophen 325 MG CAPS Take 650 mg by mouth daily.    [provider]  amantadine (SYMMETREL) 100 MG capsule Take 100 mg by mouth 2 (two) times daily.    [provider]  fluticasone (FLONASE) 50 MCG/ACT nasal spray Place 2 sprays into both nostrils as needed for allergies or rhinitis.    [provider]  glycopyrrolate (ROBINUL) 1 MG tablet Take 1 mg by mouth 3 (three) times daily.    [provider]  mirtazapine (REMERON) 30 MG tablet Take 30 mg by mouth at bedtime.    [provider]  OXcarbazepine (TRILEPTAL) 300 MG/5ML suspension Take 300 mg by mouth 2 (two) times daily.    [provider]  polyethylene glycol (MIRALAX / GLYCOLAX) packet Take 17 g by mouth daily.    [provider]   Physical Exam: Vitals:   03/26/22 1400 03/26/22 1648 03/26/22 1650 03/26/22 1800  BP: (!) 125/98  104/86 112/89  Pulse: (!) 126 (!) 109 (!) 111 (!) 104  Resp: 16 19 18 16   Temp:      TempSrc:      SpO2: 100% 100% 99% 99%  Weight:      Height:       Constitutional: appears older than chronological age, frail, cachectic, NAD, calm, comfortable Eyes: PERRL, lids and conjunctivae normal ENMT: Mucous membranes are moist. Posterior pharynx clear of any exudate or lesions.  Poor dentition/no dentition.  Unable to assess hearing Neck: normal, supple, no masses, no thyromegaly Respiratory: clear to auscultation bilaterally, no wheezing, no crackles. Normal respiratory effort. No accessory muscle use.  Cardiovascular: Regular rate and rhythm, no murmurs / rubs / gallops. No extremity edema. 2+ pedal pulses. No carotid bruits.  Abdomen: Scaphoid abdomen, no tenderness, no masses palpated, no hepatosplenomegaly. Bowel sounds positive.  Musculoskeletal: no clubbing / cyanosis. No joint deformity upper and lower extremities. Good ROM, no contractures, no atrophy. Normal muscle tone.  Skin: no rashes, lesions, ulcers. No induration Neurologic: Sensation intact. Strength 5/5 in all 4.  Psychiatric: Unable to assess judgment and insight.  Unable to assess awareness and alertness as patient is nonverbal at baseline. Unable to assess mood.   EKG: independently reviewed, showing sinus tachycardia with rate of 128, QTc 447  Chest x-ray on Admission: I personally reviewed  and I agree with radiologist reading as below.  CT Angio Chest PE W and/or Wo Contrast  Result Date: 03/26/2022 CLINICAL DATA:  Positive D-dimer.  Pulmonary embolism suspected. EXAM: CT ANGIOGRAPHY CHEST WITH CONTRAST TECHNIQUE: Multidetector CT imaging of the chest was performed using the standard protocol during bolus administration of intravenous contrast. Multiplanar CT image reconstructions and MIPs were obtained to evaluate the vascular anatomy. RADIATION DOSE REDUCTION: This exam was performed according to the departmental dose-optimization program which includes automated exposure control, adjustment of the mA and/or kV according to patient size and/or use of iterative reconstruction technique. CONTRAST:  51mL OMNIPAQUE IOHEXOL 350 MG/ML SOLN COMPARISON:  None Available. FINDINGS: Cardiovascular: Negative for pulmonary embolism. Ascending thoracic aorta measures 2.7 cm. Mild dilatation of the proximal descending thoracic aorta measuring up to 3.1 cm. Heart size is normal. No significant pericardial effusion. Mediastinum/Nodes: Thyroid tissue appears to be slightly enlarged and heterogeneous. Thyroid is incompletely imaged. No significant lymph node enlargement in the mediastinum or hila. No axillary lymph node enlargement. Lungs/Pleura: The trachea is prominent for size. Trachea and mainstem bronchi are widely patent. No large pleural effusions. Few patchy densities in the posterior right lower lobe are nonspecific but favor atelectasis and/or scarring. 3 mm calcified granuloma in the periphery of the right lower lobe on sequence 5 image 71. 2 mm peripheral nodular density in the medial right lower lobe on image 48. No large areas of airspace disease or lung consolidation. Punctate nodule in the left lower lobe on image 88. Upper Abdomen: Gaseous distention of the colon anterior to the liver. Musculoskeletal: No acute bone abnormality. There is some curvature in the thoracic spine towards the right.  Review of the MIP images confirms the above findings. IMPRESSION: 1. Negative for a pulmonary embolism. 2. Patchy densities in the posterior right lower lobe are suggestive for atelectasis or scarring. 3. Tiny pulmonary nodules measuring up to 2 mm. No follow-up needed if patient is low-risk (and has no known or suspected primary neoplasm). Non-contrast chest CT can be considered in 12 months if patient is high-risk. This recommendation follows the consensus statement: Guidelines for Management of Incidental Pulmonary Nodules Detected on CT Images: From the Fleischner Society 2017; Radiology 2017; 284:228-243. 4. Mild dilatation of the proximal descending thoracic aorta measuring up to 3.1 cm. Electronically Signed   By: Markus Daft M.D.   On: 03/26/2022 16:38   DG Chest Portable 1 View  Result Date: 03/26/2022 CLINICAL DATA:  Provided history: Tachy, syncope. EXAM: PORTABLE CHEST 1 VIEW COMPARISON:  Report from chest radiographs 03/21/2022 (images unavailable). Chest radiograph 06/15/2014. FINDINGS: Heart size within normal limits. Gaseous distension of the splenic flexure of the colon with mild elevation of the left hemidiaphragm, similar to the prior chest radiograph of 06/15/2014. No appreciable airspace consolidation or pulmonary edema. No evidence of pleural effusion or pneumothorax. Few small calcified granulomas within the right upper to mid lung field, unchanged.  No acute bony abnormality identified. Thoracic dextrocurvature. IMPRESSION: No evidence of acute cardiopulmonary abnormality. Mild elevation of the left hemidiaphragm, unchanged. Thoracic dextrocurvature. Electronically Signed   By: Kellie Simmering D.O.   On: 03/26/2022 14:53    Labs on Admission: I have personally reviewed following labs  CBC: Recent Labs  Lab 03/26/22 1356  WBC 6.7  NEUTROABS 4.4  HGB 15.7  HCT 51.3  MCV 89.7  PLT [id number]*   Basic Metabolic Panel: Recent Labs  Lab 03/26/22 1356  NA 154*  K 4.3  CL 116*  CO2 27   GLUCOSE 186*  BUN 51*  CREATININE 1.88*  CALCIUM 9.3   GFR: Estimated Creatinine Clearance: 33.1 mL/min (A) (by C-G formula based on SCr of 1.88 mg/dL (H)).  Liver Function Tests: Recent Labs  Lab 03/26/22 1356  AST 36  ALT 54*  ALKPHOS 62  BILITOT 0.6  PROT 8.5*  ALBUMIN 4.2   Urine analysis:    Component Value Date/Time   COLORURINE Yellow 06/15/2014 0825   APPEARANCEUR Clear 06/15/2014 0825   LABSPEC 1.014 06/15/2014 0825   PHURINE 7.0 06/15/2014 0825   GLUCOSEU Negative 06/15/2014 0825   HGBUR Negative 06/15/2014 0825   BILIRUBINUR Negative 06/15/2014 0825   KETONESUR Negative 06/15/2014 0825   PROTEINUR Negative 06/15/2014 0825   NITRITE Negative 06/15/2014 0825   LEUKOCYTESUR Negative 06/15/2014 0825   Dr. Tobie Poet [facility]  If 7PM-7AM, please contact overnight-coverage provider If 7AM-7PM, please contact day coverage provider [email or web address]  03/26/2022, 7:37 PM

## 2022-03-26 NOTE — Assessment & Plan Note (Signed)
-   Work-up in progress per above

## 2022-03-26 NOTE — ED Triage Notes (Signed)
Patient sent from [facility] for syncopal episode. Patient has autism and is nonverbal at baseline. Per facility, behavior has escalated over the past 30 days and patient has been not eating for 2 weeks. Patient combative for EMS.

## 2022-03-26 NOTE — Hospital Course (Addendum)
[name] is a 56 year old male with history of learning disability, autism, nonverbal at baseline, seizure disorder, who presents emergency department from [facility] for chief concerns of syncopal events. Patient has not been eating for 2 weeks.  He has a severe hypernatremia.  Upon arriving the hospital, patient is seen by speech therapy, is started eating small amount of diet.  Added megestrol.  Patient also received D5 water for hypernatremia.  Sodium level is better.

## 2022-03-26 NOTE — Assessment & Plan Note (Addendum)
-   Present on admission, dietary has been consulted

## 2022-03-26 NOTE — Assessment & Plan Note (Signed)
-   Secondary to poor p.o. intake - The solution to this may be multifactorial including psychiatric evaluation and dietitian involvement - Behavioral health service and dietary have been consulted

## 2022-03-26 NOTE — Assessment & Plan Note (Signed)
With learning disability

## 2022-03-26 NOTE — ED Provider Notes (Addendum)
[facility] Provider Note    Event Date/Time   First MD Initiated Contact with Patient 03/26/22 1359     (approximate)   History   Loss of Consciousness   HPI  Mario Mason is a 56 y.o. male who presents to the ED for evaluation of Loss of Consciousness   I reviewed PCP visit from 6/23.  Evaluated for weight loss and poor appetite.  He has a history of intellectual disability, autism and seizure disorder.  He is nonverbal at baseline.  Patient arrived to the ED with a long handwritten note from his group home was familiar with him and reports that he has lost 20-30 pounds in the past 6 months.  Poor appetite and refusal to eat.  No other recent illnesses, fevers.  There was a reported syncopal episode that was witnessed today at the facility where he syncopized for about 30 seconds without seizure activity.   Physical Exam   Triage Vital Signs: ED Triage Vitals  Enc Vitals Group     BP 03/26/22 1356 (!) 138/110     Pulse Rate 03/26/22 1356 (!) 134     Resp 03/26/22 1356 20     Temp 03/26/22 1356 97.8 F (36.6 C)     Temp Source 03/26/22 1356 Axillary     SpO2 03/26/22 1356 99 %     Weight 03/26/22 1354 117 lb 8.1 oz (53.3 kg)     Height 03/26/22 1354 6' (1.829 m)     Head Circumference --      Peak Flow --      Pain Score --      Pain Loc --      Pain Edu? --      Excl. in GC? --     Most recent vital signs: Vitals:   03/26/22 1356  BP: (!) 138/110  Pulse: (!) 134  Resp: 20  Temp: 97.8 F (36.6 C)  SpO2: 99%    General: Awake.  Looking around the room.  Following some simple commands with encouragement. CV:  Good peripheral perfusion.  Tachycardic and regular Resp:  Normal effort.  CTAB Abd:  No distention.  Soft and benign MSK:  No deformity noted.  No signs of trauma or injury Neuro:  No focal deficits appreciated.  Moving all 4, but not following of commands for thorough neurologic evaluation. Other:     ED Results /  Procedures / Treatments   Labs (all labs ordered are listed, but only abnormal results are displayed) Labs Reviewed  D-DIMER, QUANTITATIVE - Abnormal; Notable for the following components:      Result Value   D-Dimer, Quant 0.99 (*)    All other components within normal limits  CBC WITH DIFFERENTIAL/PLATELET - Abnormal; Notable for the following components:   RDW 16.7 (*)    Platelets 107 (*)    All other components within normal limits  COMPREHENSIVE METABOLIC PANEL - Abnormal; Notable for the following components:   Sodium 154 (*)    Chloride 116 (*)    Glucose, Bld 186 (*)    BUN 51 (*)    Creatinine, Ser 1.88 (*)    Total Protein 8.5 (*)    ALT 54 (*)    GFR, Estimated 41 (*)    All other components within normal limits  URINALYSIS, ROUTINE W REFLEX MICROSCOPIC  TROPONIN I (HIGH SENSITIVITY)    EKG Sinus tachycardia the rate of 128 bpm.  Rightward axis.  No STEMI.  RADIOLOGY  CXR interpreted by me without evidence of acute cardiopulmonary pathology.  Official radiology report(s): DG Chest Portable 1 View  Result Date: 03/26/2022 CLINICAL DATA:  Provided history: Tachy, syncope. EXAM: PORTABLE CHEST 1 VIEW COMPARISON:  Report from chest radiographs 03/21/2022 (images unavailable). Chest radiograph 06/15/2014. FINDINGS: Heart size within normal limits. Gaseous distension of the splenic flexure of the colon with mild elevation of the left hemidiaphragm, similar to the prior chest radiograph of 06/15/2014. No appreciable airspace consolidation or pulmonary edema. No evidence of pleural effusion or pneumothorax. Few small calcified granulomas within the right upper to mid lung field, unchanged. No acute bony abnormality identified. Thoracic dextrocurvature. IMPRESSION: No evidence of acute cardiopulmonary abnormality. Mild elevation of the left hemidiaphragm, unchanged. Thoracic dextrocurvature. Electronically Signed   By: Jackey Loge D.O.   On: 03/26/2022 14:53    PROCEDURES and  INTERVENTIONS:  .1-3 Lead EKG Interpretation  Performed by: Delton Prairie, MD Authorized by: Delton Prairie, MD     Interpretation: abnormal     ECG rate:  120   ECG rate assessment: tachycardic     Rhythm: sinus tachycardia     Ectopy: none     Conduction: normal     Medications  lactated ringers bolus 1,000 mL (1,000 mLs Intravenous Not Given 03/26/22 1412)  ziprasidone (GEODON) injection 20 mg (20 mg Intramuscular Given 03/26/22 1512)     IMPRESSION / MDM / ASSESSMENT AND PLAN / ED COURSE  I reviewed the triage vital signs and the nursing notes.  Differential diagnosis includes, but is not limited to, PE, dehydration, AKI, electrolyte derangement, protein calorie malnutrition, seizure, cardiac dysrhythmia  {Patient presents with symptoms of an acute illness or injury that is potentially life-threatening.  56 year old nonverbal autistic presents after syncopal episode today in the setting of chronic refusal to eat and weight loss.  He is persistently in sinus tachycardia without instability or hypoxia.  History is limited as he is nonverbal.  Uncertain about chest pain.  EKG is nonischemic and troponin is low, and his but his D-dimer is elevated and PE is a possibility.  We will order a CTA chest and this is pending at time of admission.  Blood work with AKI and hyponatremia, likely dehydration.  CBC is with mild thrombocytopenia, but generally reassuring.  This CXR is clear.  We will try to provide some calming agents to facilitate IV placement, fluids and CTA chest.  This is ongoing at the time of admission and hospitalist agrees to admit and take over these efforts.  Clinical Course as of 03/26/22 1531  Wed Mar 26, 2022  1420 Patient reports he ripped out his IV as a fluids are being started.  Swinging at the nurses [DS]  1458 I reviewed metabolic panel from 6/23.  Sodium of 145 and creatinine of 1.3. [DS]  1527 I consult hospitalist who agrees to admit. [DS]  1529 I consult  hospitalist who agrees to admit. [DS]    Clinical Course User Index [DS] Delton Prairie, MD     FINAL CLINICAL IMPRESSION(S) / ED DIAGNOSES   Final diagnoses:  Syncope and collapse  Hypernatremia  AKI (acute kidney injury) (HCC)  Sinus tachycardia     Rx / DC Orders   ED Discharge Orders     None        Note:  This document was prepared using Dragon voice recognition software and may include unintentional dictation errors.   Delton Prairie, MD 03/26/22 1531    Delton Prairie, MD 03/26/22 1540

## 2022-03-26 NOTE — Assessment & Plan Note (Signed)
-   Per neurology note on 10/22/2021

## 2022-03-26 NOTE — Assessment & Plan Note (Addendum)
-   Dietary has been consulted - Status post LR 1 L bolus per EDP; I have ordered LR infusion at 125 mL/h, 1 day ordered - Cervical stat x-ray has been ordered to assess for overt blockage or stenosis - Patient may benefit from barium swallow if he will take the barium, I will defer this to primary attending team in the a.m.

## 2022-03-27 DIAGNOSIS — Z681 Body mass index (BMI) 19 or less, adult: Secondary | ICD-10-CM | POA: Diagnosis not present

## 2022-03-27 DIAGNOSIS — E870 Hyperosmolality and hypernatremia: Secondary | ICD-10-CM | POA: Diagnosis present

## 2022-03-27 DIAGNOSIS — F819 Developmental disorder of scholastic skills, unspecified: Secondary | ICD-10-CM | POA: Diagnosis present

## 2022-03-27 DIAGNOSIS — Z20822 Contact with and (suspected) exposure to covid-19: Secondary | ICD-10-CM | POA: Diagnosis present

## 2022-03-27 DIAGNOSIS — E860 Dehydration: Secondary | ICD-10-CM | POA: Diagnosis present

## 2022-03-27 DIAGNOSIS — R55 Syncope and collapse: Secondary | ICD-10-CM | POA: Diagnosis present

## 2022-03-27 DIAGNOSIS — F79 Unspecified intellectual disabilities: Secondary | ICD-10-CM

## 2022-03-27 DIAGNOSIS — R1310 Dysphagia, unspecified: Secondary | ICD-10-CM | POA: Diagnosis present

## 2022-03-27 DIAGNOSIS — G40909 Epilepsy, unspecified, not intractable, without status epilepticus: Secondary | ICD-10-CM | POA: Diagnosis present

## 2022-03-27 DIAGNOSIS — D696 Thrombocytopenia, unspecified: Secondary | ICD-10-CM

## 2022-03-27 DIAGNOSIS — R4701 Aphasia: Secondary | ICD-10-CM | POA: Diagnosis not present

## 2022-03-27 DIAGNOSIS — E43 Unspecified severe protein-calorie malnutrition: Secondary | ICD-10-CM | POA: Diagnosis present

## 2022-03-27 DIAGNOSIS — F509 Eating disorder, unspecified: Secondary | ICD-10-CM | POA: Diagnosis present

## 2022-03-27 DIAGNOSIS — N179 Acute kidney failure, unspecified: Secondary | ICD-10-CM | POA: Diagnosis present

## 2022-03-27 DIAGNOSIS — I10 Essential (primary) hypertension: Secondary | ICD-10-CM | POA: Diagnosis present

## 2022-03-27 DIAGNOSIS — Z79899 Other long term (current) drug therapy: Secondary | ICD-10-CM | POA: Diagnosis not present

## 2022-03-27 DIAGNOSIS — F840 Autistic disorder: Secondary | ICD-10-CM | POA: Diagnosis present

## 2022-03-27 LAB — CBC
HCT: 43.6 % (ref 39.0–52.0)
Hemoglobin: 13.6 g/dL (ref 13.0–17.0)
MCH: 27.4 pg (ref 26.0–34.0)
MCHC: 31.2 g/dL (ref 30.0–36.0)
MCV: 87.9 fL (ref 80.0–100.0)
Platelets: 102 10*3/uL — ABNORMAL LOW (ref 150–400)
RBC: 4.96 MIL/uL (ref 4.22–5.81)
RDW: 16.4 % — ABNORMAL HIGH (ref 11.5–15.5)
WBC: 5 10*3/uL (ref 4.0–10.5)
nRBC: 0 % (ref 0.0–0.2)

## 2022-03-27 LAB — BASIC METABOLIC PANEL
Anion gap: 1 — ABNORMAL LOW (ref 5–15)
BUN: 31 mg/dL — ABNORMAL HIGH (ref 6–20)
CO2: 30 mmol/L (ref 22–32)
Calcium: 8.5 mg/dL — ABNORMAL LOW (ref 8.9–10.3)
Chloride: 121 mmol/L — ABNORMAL HIGH (ref 98–111)
Creatinine, Ser: 1.21 mg/dL (ref 0.61–1.24)
GFR, Estimated: >60 mL/min (ref >60)
Glucose, Bld: 96 mg/dL (ref 70–99)
Potassium: 3.9 mmol/L (ref 3.5–5.1)
Sodium: 152 mmol/L — ABNORMAL HIGH (ref 135–145)

## 2022-03-27 LAB — HIV ANTIBODY (ROUTINE TESTING W REFLEX): HIV Screen 4th Generation wRfx: NONREACTIVE

## 2022-03-27 LAB — TSH: TSH: 3.28 u[IU]/mL (ref 0.350–4.500)

## 2022-03-27 LAB — TROPONIN I (HIGH SENSITIVITY): Troponin I (High Sensitivity): 8 ng/L (ref <18)

## 2022-03-27 LAB — PROCALCITONIN: Procalcitonin: <0.1 ng/mL

## 2022-03-27 LAB — SARS CORONAVIRUS 2 BY RT PCR: SARS Coronavirus 2 by RT PCR: NEGATIVE

## 2022-03-27 MED ORDER — MEGESTROL ACETATE 400 MG/10ML PO SUSP
400.0000 mg | Freq: Every day | ORAL | Status: DC
  Filled 2022-03-27 (×2): qty 10

## 2022-03-27 MED ORDER — ADULT MULTIVITAMIN W/MINERALS CH
1.0000 | ORAL_TABLET | Freq: Every day | ORAL | Status: DC
  Administered 2022-03-28 – 2022-04-02 (×6): 1 via ORAL
  Filled 2022-03-27 (×7): qty 1

## 2022-03-27 MED ORDER — ENSURE ENLIVE PO LIQD
237.0000 mL | Freq: Three times a day (TID) | ORAL | Status: DC
  Administered 2022-03-28 – 2022-03-30 (×2): 237 mL via ORAL

## 2022-03-27 MED ORDER — DEXTROSE 5 % IV SOLN: INTRAVENOUS | Status: AC

## 2022-03-27 NOTE — Consult Note (Signed)
[facility] Face-to-Face Psychiatry Consult   Reason for Consult: "Acting out, agitation" Referring Physician: Cox Patient Identification: Mario Mason MRN:  [id number] Principal Diagnosis: Syncope Diagnosis:  Principal Problem:   Syncope Active Problems:   Nonverbal   Autism   Sinus tachycardia   Weight loss   Nutritional deficiency due to eating disorder   Refuses to eat   Protein-calorie malnutrition, severe (Mario Mason)   Seizure disorder (Mario Mason)   Hypernatremia   Thrombocytopenia (Mario Mason)   Total Time spent with patient: 30 minutes . Subjective:   Mario Mason is a 56 y.o. male patient admitted with not eating for a few weeks.Marland Kitchen  HPI: Patient presented to the ED via EMS after loss of consciousness at home (he lives at [facility]). Caregivers report that for about a month, he has had trouble swallowing, not eating, and losing weight and has been to outpatient providers regarding these issues. ( This is documented in chart notes). Patient is non-verbal, with diagnoses above.  Patient is seen face-to-face in his room.  Caretaker from [name] home is at his bedside.  She identifies herself as Pamala Hurry and states she has been with him for 7 years.  Patient is calm, sitting in bed rocks gently back and forth.  He holds out his hand to take my hand.  Makes good eye contact.   Pamala Hurry states that he tries to put people's hands on his throat, indicating that it is hard for him to swallow or eat.  While talking with Leeroy Bock notices that patient does have some food on his overbearing table.  He picks up a nutritional frozen cup that has a spoon and it and takes a bite.  He then tries to put writers hand on his throat.  This is not done in a aggressive way at all.  Pamala Hurry states that he has never been aggressive.  He just gets frustrated when people try to do things that he does not understand.  He seems to understand if you can tell him exactly what you are going to be doing.  Pamala Hurry states  that he likes fidgeting with large beads at the facility.  He will roll them in his fingers.  He also enjoys television and music.  Writer asked patient's bedside nurse if she might have OT bring him some appropriate "fidgeting" objects. Patient's bedside RN Ward Chatters) stated that the medical team has found that he had a similar incident of not wanting to eat and weight loss in 2019.  Per Pamala Hurry, caregiver, they determined it was some kind of allergy.  Writer discussed this with patient's current bedside RN Ward Chatters).  She states that the medical team were investigating this and were going to get an ENT consult.     Patient has a guardian: Mario Mason, [phone]   Collateral from Yvone Neu, coordinator, [place] [phone]):  The Home psychologist, Deidre Ala, happened to be at the facility and was included in the conversation, agreeing with Ken's perceptions.  Yvone Neu states "I know they asked for a psych consult, but he points to his throat; we think this is medical."  We are brainstorming, trying to figure out what is going on. He reports that patient is not aggressive, he would just knock our hands away when we tried to get him to eat and he points to his throat." They report that they think something is "going on with or in his throat." Deidre Ala states that the patient came to [facility] about 5-6 years  ago from home. His mother had been caring for him at home and she passed away. They think he had some behavioral issues prior to coming, but he has been maintained on the medications he came with and has done very well. He takes Remeron 30 mg at night , per chart review.  It is reported that his psychiatrist is Engineer, agricultural at [facility] neuropsychiatry.    Past Psychiatric History: none clearly reported  Risk to Self:   Risk to Others:   Prior Inpatient Therapy:   Prior Outpatient Therapy:    Past Medical History:  Past Medical History:  Diagnosis Date   Constipation     Hypertension    Intellectual disability with language impairment and autistic features    Seizures ([place])     Past Surgical History:  Procedure Laterality Date   CATARACT EXTRACTION W/ INTRAOCULAR LENS IMPLANT  2013   both eyes done at different times   DG TEETH FULL     at [facility]   ESOPHAGOGASTRODUODENOSCOPY (EGD) WITH PROPOFOL N/A 02/03/2018   Procedure: ESOPHAGOGASTRODUODENOSCOPY (EGD) WITH PROPOFOL;  Surgeon: Toledo, Benay Pike, MD;  Location: [facility] ENDOSCOPY;  Service: Gastroenterology;  Laterality: N/A;   Family History: No family history on file. Family Psychiatric  History: Unknown Social History:  Social History   Substance and Sexual Activity  Alcohol Use Never     Social History   Substance and Sexual Activity  Drug Use Never    Social History   Socioeconomic History   Marital status: Single    Spouse name: Not on file   Number of children: Not on file   Years of education: Not on file   Highest education level: Not on file  Occupational History   Not on file  Tobacco Use   Smoking status: Never   Smokeless tobacco: Never  Vaping Use   Vaping Use: Never used  Substance and Sexual Activity   Alcohol use: Never   Drug use: Never   Sexual activity: Not on file  Other Topics Concern   Not on file  Social History Narrative   Not on file   Social Determinants of Health   Financial Resource Strain: Not on file  Food Insecurity: Not on file  Transportation Needs: Not on file  Physical Activity: Not on file  Stress: Not on file  Social Connections: Not on file   Additional Social History:    Allergies:  No Known Allergies  Labs:  Results for orders placed or performed during the hospital encounter of 03/26/22 (from the past 48 hour(s))  SARS Coronavirus 2 by RT PCR (hospital order, performed in [facility] hospital lab) *cepheid single result test* Anterior Nasal Swab     Status: None   Collection Time: 03/26/22 12:20 PM   Specimen: Anterior Nasal Swab   Result Value Ref Range   SARS Coronavirus 2 by RT PCR NEGATIVE NEGATIVE    Comment: (NOTE) SARS-CoV-2 target nucleic acids are NOT DETECTED.  The SARS-CoV-2 RNA is generally detectable in upper and lower respiratory specimens during the acute phase of infection. The lowest concentration of SARS-CoV-2 viral copies this assay can detect is 250 copies / mL. A negative result does not preclude SARS-CoV-2 infection and should not be used as the sole basis for treatment or other patient management decisions.  A negative result may occur with improper specimen collection / handling, submission of specimen other than nasopharyngeal swab, presence of viral mutation(s) within the areas targeted by this assay, and inadequate number  of viral copies (<250 copies / mL). A negative result must be combined with clinical observations, patient history, and epidemiological information.  Fact Sheet for Patients:   RoadLapTop.co.za  Fact Sheet for Healthcare Providers: http://kim-miller.com/  This test is not yet approved or  cleared by the [place] FDA and has been authorized for detection and/or diagnosis of SARS-CoV-2 by FDA under an Emergency Use Authorization (EUA).  This EUA will remain in effect (meaning this test can be used) for the duration of the COVID-19 declaration under Section 564(b)(1) of the Act, 21 U.S.C. section 360bbb-3(b)(1), unless the authorization is terminated or revoked sooner.  Performed at [facility], [address] [place], [place] [address]   D-dimer, quantitative     Status: Abnormal   Collection Time: 03/26/22  1:56 PM  Result Value Ref Range   D-Dimer, Quant 0.99 (H) 0.00 - 0.50 ug/mL-FEU    Comment: (NOTE) At the manufacturer cut-off value of 0.5 g/mL FEU, this assay has a negative predictive value of 95-100%.This assay is intended for use in conjunction with a clinical pretest probability (PTP)  assessment model to exclude pulmonary embolism (PE) and deep venous thrombosis (DVT) in outpatients suspected of PE or DVT. Results should be correlated with clinical presentation. Performed at [facility], [address] [place], [place] [address]   CBC with Differential/Platelet     Status: Abnormal   Collection Time: 03/26/22  1:56 PM  Result Value Ref Range   WBC 6.7 4.0 - 10.5 K/uL   RBC 5.72 4.22 - 5.81 MIL/uL   Hemoglobin 15.7 13.0 - 17.0 g/dL   HCT 00.9 38.1 - 82.9 %   MCV 89.7 80.0 - 100.0 fL   MCH 27.4 26.0 - 34.0 pg   MCHC 30.6 30.0 - 36.0 g/dL   RDW 93.7 (H) 16.9 - 67.8 %   Platelets 107 (L) 150 - 400 K/uL    Comment: REPEATED TO VERIFY   nRBC 0.0 0.0 - 0.2 %   Neutrophils Relative % 66 %   Neutro Abs 4.4 1.7 - 7.7 K/uL   Lymphocytes Relative 24 %   Lymphs Abs 1.6 0.7 - 4.0 K/uL   Monocytes Relative 7 %   Monocytes Absolute 0.5 0.1 - 1.0 K/uL   Eosinophils Relative 3 %   Eosinophils Absolute 0.2 0.0 - 0.5 K/uL   Basophils Relative 0 %   Basophils Absolute 0.0 0.0 - 0.1 K/uL   Immature Granulocytes 0 %   Abs Immature Granulocytes 0.01 0.00 - 0.07 K/uL    Comment: Performed at [facility], [address] [place], [place] [address]  Comprehensive metabolic panel     Status: Abnormal   Collection Time: 03/26/22  1:56 PM  Result Value Ref Range   Sodium 154 (H) 135 - 145 mmol/L   Potassium 4.3 3.5 - 5.1 mmol/L   Chloride 116 (H) 98 - 111 mmol/L   CO2 27 22 - 32 mmol/L   Glucose, Bld 186 (H) 70 - 99 mg/dL    Comment: Glucose reference range applies only to samples taken after fasting for at least 8 hours.   BUN 51 (H) 6 - 20 mg/dL   Creatinine, Ser 1.75 (H) 0.61 - 1.24 mg/dL   Calcium 9.3 8.9 - 10.2 mg/dL   Total Protein 8.5 (H) 6.5 - 8.1 g/dL   Albumin 4.2 3.5 - 5.0 g/dL   AST 36 15 - 41 U/L   ALT 54 (H) 0 - 44 U/L   Alkaline  Phosphatase 62 38 - 126 U/L   Total Bilirubin 0.6 0.3 - 1.2 mg/dL   GFR, Estimated 41 (L) >60 mL/min     Comment: (NOTE) Calculated using the CKD-EPI Creatinine Equation (2021)    Anion gap 11 5 - 15    Comment: Performed at [facility], [address] [place], [place] [address]  Troponin I (High Sensitivity)     Status: None   Collection Time: 03/26/22  1:56 PM  Result Value Ref Range   Troponin I (High Sensitivity) 10 <18 ng/L    Comment: (NOTE) Elevated high sensitivity troponin I (hsTnI) values and significant  changes across serial measurements may suggest ACS but many other  chronic and acute conditions are known to elevate hsTnI results.  Refer to the "Links" section for chest pain algorithms and additional  guidance. Performed at [facility], [address] [place], [place] [address]   Basic metabolic panel     Status: Abnormal   Collection Time: 03/27/22  4:54 AM  Result Value Ref Range   Sodium 152 (H) 135 - 145 mmol/L   Potassium 3.9 3.5 - 5.1 mmol/L   Chloride 121 (H) 98 - 111 mmol/L   CO2 30 22 - 32 mmol/L   Glucose, Bld 96 70 - 99 mg/dL    Comment: Glucose reference range applies only to samples taken after fasting for at least 8 hours.   BUN 31 (H) 6 - 20 mg/dL   Creatinine, Ser 3.15 0.61 - 1.24 mg/dL   Calcium 8.5 (L) 8.9 - 10.3 mg/dL   GFR, Estimated >40 >08 mL/min    Comment: (NOTE) Calculated using the CKD-EPI Creatinine Equation (2021)    Anion gap 1 (L) 5 - 15    Comment: Performed at [facility], [address] [place], [place] [address]  CBC     Status: Abnormal   Collection Time: 03/27/22  4:54 AM  Result Value Ref Range   WBC 5.0 4.0 - 10.5 K/uL   RBC 4.96 4.22 - 5.81 MIL/uL   Hemoglobin 13.6 13.0 - 17.0 g/dL   HCT 50.9 32.6 - 71.2 %   MCV 87.9 80.0 - 100.0 fL   MCH 27.4 26.0 - 34.0 pg   MCHC 31.2 30.0 - 36.0 g/dL   RDW 45.8 (H) 09.9 - 83.3 %   Platelets 102 (L) 150 - 400 K/uL   nRBC 0.0 0.0 - 0.2 %    Comment: Performed at [facility], [address] [place], [place] [address]  HIV Antibody  (routine testing w rflx)     Status: None   Collection Time: 03/27/22  4:54 AM  Result Value Ref Range   HIV Screen 4th Generation wRfx Non Reactive Non Reactive    Comment: Performed at [facility], [address] [place], [place] [address]  Troponin I (High Sensitivity)     Status: None   Collection Time: 03/27/22  4:54 AM  Result Value Ref Range   Troponin I (High Sensitivity) 8 <18 ng/L    Comment: (NOTE) Elevated high sensitivity troponin I (hsTnI) values and significant  changes across serial measurements may suggest ACS but many other  chronic and acute conditions are known to elevate hsTnI results.  Refer to the "Links" section for chest pain algorithms and additional  guidance. Performed at [facility], [address] [place], [place] [address]   TSH     Status: None   Collection Time: 03/27/22  4:54 AM  Result Value Ref Range   TSH 3.280 0.350 - 4.500 uIU/mL    Comment: Performed by a 3rd Generation assay with a functional sensitivity of <=0.01 uIU/mL. Performed at [facility], [address] [place], [place] [address]     Current Facility-Administered Medications  Medication Dose Route Frequency Provider Last Rate Last Admin   acetaminophen (TYLENOL) tablet 650 mg  650 mg Oral Q6H PRN Cox, Amy N, DO       Or   acetaminophen (TYLENOL) suppository 650 mg  650 mg Rectal Q6H PRN Cox, Amy N, DO       amantadine (SYMMETREL) capsule 100 mg  100 mg Oral BID Cox, Amy N, DO   100 mg at 03/26/22 2105   amoxicillin (AMOXIL) 250 MG/5ML suspension 800 mg  800 mg Oral BID Cox, Amy N, DO       azelastine (ASTELIN) 0.1 % nasal spray 2 spray  2 spray Each Nare BID Cox, Amy N, DO   2 spray at 03/26/22 2105   dextrose 5 % solution   Intravenous Continuous Marrion Coy, MD 75 mL/hr at 03/27/22 0907 New Bag at 03/27/22 0907   enoxaparin (LOVENOX) injection 40 mg  40 mg Subcutaneous Q24H Cox, Amy N, DO   40 mg at 03/26/22 2116   famotidine (PEPCID) tablet 20  mg  20 mg Oral Daily Cox, Amy N, DO       feeding supplement (ENSURE ENLIVE / ENSURE PLUS) liquid 237 mL  237 mL Oral TID BM Marrion Coy, MD       fluticasone (FLONASE) 50 MCG/ACT nasal spray 2 spray  2 spray Each Nare PRN Cox, Amy N, DO       haloperidol lactate (HALDOL) injection 2 mg  2 mg Intramuscular Q6H PRN Cox, Amy N, DO   2 mg at 03/26/22 2104   LORazepam (ATIVAN) injection 1 mg  1 mg Intravenous Q6H PRN Cox, Amy N, DO       LORazepam (ATIVAN) injection 2 mg  2 mg Intravenous PRN Cox, Amy N, DO       megestrol (MEGACE) 400 MG/10ML suspension 400 mg  400 mg Oral Daily Marrion Coy, MD       mirtazapine (REMERON) tablet 30 mg  30 mg Oral QHS Cox, Amy N, DO   30 mg at 03/26/22 2116   mometasone-formoterol (DULERA) 100-5 MCG/ACT inhaler 2 puff  2 puff Inhalation BID Cox, Amy N, DO   2 puff at 03/26/22 2105   montelukast (SINGULAIR) tablet 10 mg  10 mg Oral Daily Cox, Amy N, DO   10 mg at 03/26/22 2105   multivitamin with minerals tablet 1 tablet  1 tablet Oral Daily Marrion Coy, MD       ondansetron (ZOFRAN) tablet 4 mg  4 mg Oral Q6H PRN Cox, Amy N, DO       Or   ondansetron (ZOFRAN) injection 4 mg  4 mg Intravenous Q6H PRN Cox, Amy N, DO       OXcarbazepine (TRILEPTAL) 300 MG/5ML suspension 300 mg  300 mg Oral BID Cox, Amy N, DO       polyethylene glycol (MIRALAX / GLYCOLAX) packet 17 g  17 g Oral Daily PRN Cox, Amy N, DO       senna-docusate (Senokot-S) tablet 1 tablet  1 tablet Oral QHS PRN Cox, Amy N, DO        Musculoskeletal: Strength & Muscle Tone: Did not fully assess Gait & Station:  Unable to assess  Patient leans: N/A   Psychiatric Specialty Exam: not performed  Presentation  General Appearance: No data recorded Eye Contact:No data recorded Speech:No data recorded Speech Volume:No data recorded Handedness:No data recorded  Mood and Affect  Mood:No data recorded Affect:No data recorded  Thought Process  Thought Processes:No data recorded Descriptions of  Associations:No data recorded Orientation:No data recorded Thought Content:No data recorded History of Schizophrenia/Schizoaffective disorder:No data recorded Duration of Psychotic Symptoms:No data recorded Hallucinations:No data recorded Ideas of Reference:No data recorded Suicidal Thoughts:No data recorded Homicidal Thoughts:No data recorded  Sensorium  Memory:No data recorded Judgment:No data recorded Insight:No data recorded  Executive Functions  Concentration:No data recorded Attention Span:No data recorded Recall:No data recorded Fund of Knowledge:No data recorded Language:No data recorded  Psychomotor Activity  Psychomotor Activity:No data recorded  Assets  Assets:No data recorded  Sleep  Sleep:No data recorded  Physical Exam: Physical Exam Vitals and nursing note reviewed.  HENT:     Head: Normocephalic.     Nose: No congestion or rhinorrhea.  Eyes:     General:        Right eye: No discharge.        Left eye: No discharge.  Cardiovascular:     Rate and Rhythm: Normal rate.  Pulmonary:     Effort: Pulmonary effort is normal.  Musculoskeletal:        General: Normal range of motion.     Cervical back: Normal range of motion.  Skin:    General: Skin is dry.  Neurological:     Mental Status: He is alert.    Review of Systems  Reason unable to perform ROS: Patient is nonverbal, profound IDD, autistic.  Constitutional: Negative.   Skin: Negative.    Blood pressure 140/89, pulse 84, temperature 98.4 F (36.9 C), resp. rate 15, height 6' (1.829 m), weight 53.3 kg, SpO2 100 %. Body mass index is 15.94 kg/m.  Treatment Plan Summary: No psychiatric recommendations at this time.  Continue with patient's home medication of Remeron 30 mg nightly.  Disposition: No evidence of imminent risk to self or others at present.   Patient does not meet criteria for psychiatric inpatient admission.  Sherlon Handing, NP 03/27/2022 2:38 PM

## 2022-03-27 NOTE — Progress Notes (Signed)
Group home coordinator Rocky Link [phone]) faxed over guardianship paper, placed in chart

## 2022-03-27 NOTE — Care Management (Signed)
Please contact patient's nurse on discharge (from [facility]) [phone] cell, [phone] fax. Dr Lynnell Grain [phone]

## 2022-03-27 NOTE — Evaluation (Addendum)
Clinical/Bedside Swallow Evaluation Patient Details  Name: Mario Mason MRN: [id number] Date of Birth: 09/01/1966  Today's Date: 03/27/2022 Time: SLP Start Time (ACUTE ONLY): 1245 SLP Stop Time (ACUTE ONLY): 1345 SLP Time Calculation (min) (ACUTE ONLY): 60 min  Past Medical History:  Past Medical History:  Diagnosis Date   Constipation    Hypertension    Intellectual disability with language impairment and autistic features    Seizures (HCC)    Past Surgical History:  Past Surgical History:  Procedure Laterality Date   CATARACT EXTRACTION W/ INTRAOCULAR LENS IMPLANT  2013   both eyes done at different times   DG TEETH FULL     at [facility]   ESOPHAGOGASTRODUODENOSCOPY (EGD) WITH PROPOFOL N/A 02/03/2018   Procedure: ESOPHAGOGASTRODUODENOSCOPY (EGD) WITH PROPOFOL;  Surgeon: Toledo, Boykin Nearing, MD;  Location: [facility] ENDOSCOPY;  Service: Gastroenterology;  Laterality: N/A;   HPI:  Pt is a 56 year old male with history of  intellectual disability, autism, nonverbal at baseline, seizure disorder, who presents emergency department from [facility] for chief concerns of syncopal events.  Patient has not been eating well for 2+ weeks refusing any medication or solid food but will sip liquid -- he gestures/communicates he has a "sore throat". The group home was familiar with him stated he is on a Puree diet there and reports that he has lost 20-30 pounds in the past 6 months. He has a severe hypernatremia at admit, malnutrition.    OF NOTE: Pt has been seen by his PCP and recommended f/u w/ ENT(see PCP note on 03/21/2022).  Per chart, pt had a similar episode w/ weight loss, sore throat, and not eating in 2019 w/ EGD completed then.    Chest Imaging: No evidence of acute cardiopulmonary abnormality; patchy density in right base suggesting atelectasis or scarring. Unsure if pt has REFLUX or not.    Assessment / Plan / Recommendation  Clinical Impression   Pt appears to present w/ Oral phase  dysphagia (on a Puree consistency food diet at Group Home baseline) in setting of Baseline Intellectual Disability w/ cognitive decline AND is currently impacted by apparent Odynophagia. Caregiver and Group Home reported that pt's swallowing "pain" has "worsened" in the past 2+ weeks w/ Weight Loss noted in the past ~6 months. Pt was being seen by Outpatient PCP this month x2 and recommended to f/u w/ ENT(see PCP note 03/21/2022). ANY cognitive decline and/or Odynophagia can impact overall awareness/timing of swallow and safety during po tasks which increases risk for aspiration, choking as well as reduced oral intake/nutrition risk.  Pt's risk for aspiration can be reduced when following general aspiration precautions and maintaining his Baseline dysphagia diet (puree consistency) w/ thin liquids w/ Supervision/support at meals.         Pt sat in middle of bed w/ legs crossed feeding himself trials of Puree (magic cup ice cream) and sips of thin liquids VIA CUP w/ No overt clinical s/s of aspiration noted: no cough, and no decline in respiratory status during/post trials. Oral phase was c/b mildly decreased oral tone w/ open-mouth posture at rest and during oral intake resulting in anterior leakage x3-4. Caregiver endorsed that was baseline posture for pt. Pt was able to pull boluses from the utensil and demonstrate adequate bolus management and oral clearing of the boluses given Time. A-P transfer was adequate w/ thin liquids. Pt was also bent over w/ head down -- suspect gravity impacted oral leakage as well. He was able to feed self  which improves safety of swallowing. He required min+ verbal/visual/tactile cues to encourage po intake during tasks, however, pt only fed himself what he wanted and pushed away what he did not want. He communicated through gestures his likes/dislikes.  OM Exam appeared [facility] w/ No unilateral weakness noted; just decreased oral tone.   Pt repeatedly brought this Clinician's hand  to HIS throat to "rub" it after swallowing during the session -- indicating his "throat pain" per assessment, then Caregiver's report.          In setting of Baseline co-morbidities and his baseline diet of Puree, recommend the dysphagia level 1(puree foods moistened w/ gravies for ease of oral phase) w/ thin liquids; general aspiration precautions; reduce Distractions during meals and engage pt during meals for self-feeding. Pills Crushed in Puree for safer swallowing as needed. Support at meals as needed. MD/NSG updated.  ST services recommends follow w/ ENT for further assessment of Odynophagia and its impact on swallowing/oral intake. Suspect pt is close to/at his baseline re: oropharyngeal swallowing. Precautions posted in room. MD/NSG updated. SLP Visit Diagnosis: Dysphagia, oral phase (R13.11) (mild)    Aspiration Risk  Mild aspiration risk;Risk for inadequate nutrition/hydration (reduced following general aspiration precautions; support and supervision)    Diet Recommendation   dysphagia level 1(puree foods moistened w/ gravies for ease of oral phase) w/ thin liquids; general aspiration precautions; reduce Distractions during meals and engage pt during meals for self-feeding. Support at meals as needed w/ tray setup, options.  Medication Administration: Crushed with puree    Other  Recommendations Recommended Consults: Consider ENT evaluation (Dietician f/u) Oral Care Recommendations: Oral care BID;Oral care before and after PO;Staff/trained caregiver to provide oral care Other Recommendations:  (n/a)    Recommendations for follow up therapy are one component of a multi-disciplinary discharge planning process, led by the attending physician.  Recommendations may be updated based on patient status, additional functional criteria and insurance authorization.  Follow up Recommendations No SLP follow up      Assistance Recommended at Discharge Frequent or constant Supervision/Assistance  (baseline)  Functional Status Assessment Patient has had a recent decline in their functional status and demonstrates the ability to make significant improvements in function in a reasonable and predictable amount of time.  Frequency and Duration  (n/a)   (n/a)       Prognosis Prognosis for Safe Diet Advancement: Fair Barriers to Reach Goals: Cognitive deficits;Language deficits;Time post onset;Severity of deficits;Behavior Barriers/Prognosis Comment: current odynophagia      Swallow Study   General Date of Onset: 03/26/22 HPI: Pt is a 56 year old male with history of  intellectual disability, autism, nonverbal at baseline, seizure disorder, who presents emergency department from [facility] for chief concerns of syncopal events.  Patient has not been eating well for 2+ weeks refusing any medication or solid food but will sip liquid -- he gestures/communicates he has a "sore throat". The group home was familiar with him and reports that he has lost 20-30 pounds in the past 6 months. He has a severe hypernatremia at admit, malnutrition.   OF NOTE: Pt has been seen by his PCP and recommended f/u w/ ENT(see PCP note on 03/21/2022).  Per chart, pt had a similar episode w/ weight loss, sore throat, and not eating in 2019 w/ EGD completed then.   Chest Imaging: No evidence of acute cardiopulmonary abnormality; patchy density in right base suggesting atelectasis or scarring. Unsure if pt has REFLUX or not. Type of Study: Bedside Swallow  Evaluation Previous Swallow Assessment: none Diet Prior to this Study: Thin liquids;Dysphagia 1 (puree) (baseline diet at group home) Temperature Spikes Noted: No (wbc 5.0) Respiratory Status: Room air History of Recent Intubation: No Behavior/Cognition: Alert;Pleasant mood;Requires cueing (Intellectual Disability; nonverbal) Oral Cavity Assessment:  (CNT d/t pt participation) Oral Care Completed by SLP: No Oral Cavity - Dentition:  (unknown) Vision:  Functional for self-feeding Self-Feeding Abilities: Able to feed self;Needs set up Patient Positioning: Upright in bed Baseline Vocal Quality:  (nonverbal) Volitional Cough: Cognitively unable to elicit Volitional Swallow: Unable to elicit    Oral/Motor/Sensory Function Overall Oral Motor/Sensory Function: Mild impairment (no unilateral weakness noted during oral movements; mildly decreased tone w/ open-mouth posture at rest/during po's at times(baseline))   Ice Chips Ice chips: Not tested   Thin Liquid Thin Liquid: Within functional limits Presentation: Self Fed (4 trials) Other Comments: pt declined further    Nectar Thick Nectar Thick Liquid: Not tested   Honey Thick Honey Thick Liquid: Not tested   Puree Puree: Impaired (mild) Presentation: Self Fed;Spoon (15+ trials) Oral Phase Impairments: Reduced labial seal Oral Phase Functional Implications:  (anterior leakage x3-4) Pharyngeal Phase Impairments:  (none) Other Comments: pt had a head-down posture often   Solid     Solid: Not tested Other Comments: not his baseline diet         Jerilynn Som, MS, CCC-SLP Speech Language Pathologist Rehab Services; [facility] [place] [phone] (ascom) Wessie Shanks 03/27/2022,2:33 PM

## 2022-03-27 NOTE — Progress Notes (Signed)
  Progress Note   Patient: Mario Mason QIW:[id number] DOB: Jun 16, 1966 DOA: 03/26/2022     0 DOS: the patient was seen and examined on 03/27/2022   Brief hospital course: [name] is a 56 year old male with history of learning disability, autism, nonverbal at baseline, seizure disorder, who presents emergency department from [facility] for chief concerns of syncopal events. Patient has not been eating for 2 weeks.  He has a severe hypernatremia.   Assessment and Plan: Syncope secondary to dehydration. Severe hypernatremia secondary to dehydration Severe protein calorie malnutrition secondary to poor appetite. Anorexia. Patient has not been eating for the last 2 weeks.  I spoke with RN, patient was able to take in liquid, but has been refusing any medication or solid food. Not sure patient has any swallow difficulties, obtain speech therapy evaluation. I also added megestrol, but so far patient has been refusing it. Patient also had severe hypernatremia, appears to be due to dehydration.  I will start D5 water for 10 hours.  Recheck BMP tomorrow.  Also follow magnesium and phosphorus level. Since patient started eating already, I will monitor patient closely.  We will consider tube feeding if patient does not improve.  Seizure disorder. Patient did not have any seizure activity recently.  Mental retardation. Autism. He is seen by psychiatry for behavior abnormality.     Subjective:  Patient was able to eat some liquid.  Does not appear to have any nausea vomiting.  Physical Exam: Vitals:   03/27/22 0100 03/27/22 0505 03/27/22 0742 03/27/22 0839  BP: (!) 140/95 113/80 118/86   Pulse: 100 (!) 108 (!) 113 (!) 109  Resp: 18 18 15    Temp: 98 F (36.7 C) 98.2 F (36.8 C) 98.5 F (36.9 C)   TempSrc: Axillary     SpO2: 100% 96%    Weight:      Height:       General exam: Appears calm and comfortable, appears severely malnourished Respiratory system: Clear to  auscultation. Respiratory effort normal. Cardiovascular system: S1 & S2 heard, RRR. No JVD, murmurs, rubs, gallops or clicks. No pedal edema. Gastrointestinal system: Abdomen is nondistended, soft and nontender. No organomegaly or masses felt. Normal bowel sounds heard. Central nervous system: Alert and nonverbal. No focal neurological deficits. Extremities: Symmetric 5 x 5 power. Skin: No rashes, lesions or ulcers  Data Reviewed:  Lab results reviewed  Family Communication:   Disposition: Status is: Inpatient Remains inpatient appropriate because: Severity of disease.  IV treatment.  Planned Discharge Destination:  group home    Time spent: 55 minutes  Author: , MD 03/27/2022 12:05 PM  For on call review [email or web address].

## 2022-03-27 NOTE — Progress Notes (Signed)
Initial Nutrition Assessment  DOCUMENTATION CODES:   Underweight  INTERVENTION:  Ensure Enlive po BID, each supplement provides 350 kcal and 20 grams of protein. Magic cup TID with meals, each supplement provides 290 kcal and 9 grams of protein MVI with minerals daily Recommend SLP to assess for possible contributing factors of decreased oral intake  NUTRITION DIAGNOSIS:   Inadequate oral intake related to acute illness as evidenced by estimated needs.  GOAL:   Patient will meet greater than or equal to 90% of their needs  MONITOR:   PO intake, Supplement acceptance, Diet advancement, Labs, Weight trends  REASON FOR ASSESSMENT:   Consult Assessment of nutrition requirement/status  ASSESSMENT:   Pt admitted from [facility] with syncope. PMH significant for learning disability, autism, nonverbal at baseline and seizure disorder.  Pt sitting up in bed. Multiple family present at bedside. They report that since he lives in a group home, they are unsure of his usual intake but report that his meal intake has decreased significantly within the last 2 weeks. Upon reviewing chart, the pt's caregiver reported that he has had poor PO intake within the last 2 months and over the last 2 weeks has refused to eat. His family states that anytime he drinks something he will either hold his throat or grab his face.   Spoke with RN at bedside who states that he did not want the Ensure today but has had lots of applesauce, juice and ice cream. Noted plans for possible MBS. Recommend SLP consult to assess for possible swallowing/mouth/throat causes contributing to decreased intake.   Could consider initiation of temporary tube feeding access if decreased intake is found to be an acute problem, however would recommend long term nutrition access if his decreased intake is determined to be more chronic in nature.  Pt's family reports that he has had noticeable wt loss but is uncertain of  his usual wt and how much. Unfortunately, there is limited documentation of wt history to review to assess for recent wt loss. Current wt noted to be 53.3 kg.   Medications: pepcid, megace, remeron IV drips: D5 @75ml /hr  Labs: sodium 152, BUN 31, Ca 8.5, anion gap 1  NUTRITION - FOCUSED PHYSICAL EXAM: Multiple family and nursing staff present. Deferred to follow up.   Diet Order:   Diet Order             DIET - DYS 1 Room service appropriate? No; Fluid consistency: Thin  Diet effective now                   EDUCATION NEEDS:   No education needs have been identified at this time  Skin:  Skin Assessment: Reviewed RN Assessment  Last BM:  PTA  Height:   Ht Readings from Last 1 Encounters:  03/26/22 6' (1.829 m)    Weight:   Wt Readings from Last 1 Encounters:  03/26/22 53.3 kg    Ideal Body Weight:  80.9 kg  BMI:  Body mass index is 15.94 kg/m.  Estimated Nutritional Needs:   Kcal:  2000-2200  Protein:  100-115g  Fluid:  >/=2L  03/28/22, RDN, LDN Clinical Nutrition

## 2022-03-28 DIAGNOSIS — R55 Syncope and collapse: Secondary | ICD-10-CM | POA: Diagnosis not present

## 2022-03-28 DIAGNOSIS — E43 Unspecified severe protein-calorie malnutrition: Secondary | ICD-10-CM | POA: Diagnosis not present

## 2022-03-28 DIAGNOSIS — F79 Unspecified intellectual disabilities: Secondary | ICD-10-CM

## 2022-03-28 DIAGNOSIS — E870 Hyperosmolality and hypernatremia: Secondary | ICD-10-CM | POA: Diagnosis not present

## 2022-03-28 LAB — PHOSPHORUS: Phosphorus: 3.5 mg/dL (ref 2.5–4.6)

## 2022-03-28 LAB — BASIC METABOLIC PANEL
Anion gap: 6 (ref 5–15)
BUN: 17 mg/dL (ref 6–20)
CO2: 27 mmol/L (ref 22–32)
Calcium: 8.5 mg/dL — ABNORMAL LOW (ref 8.9–10.3)
Chloride: 113 mmol/L — ABNORMAL HIGH (ref 98–111)
Creatinine, Ser: 1.13 mg/dL (ref 0.61–1.24)
GFR, Estimated: >60 mL/min (ref >60)
Glucose, Bld: 111 mg/dL — ABNORMAL HIGH (ref 70–99)
Potassium: 3.5 mmol/L (ref 3.5–5.1)
Sodium: 146 mmol/L — ABNORMAL HIGH (ref 135–145)

## 2022-03-28 LAB — MAGNESIUM: Magnesium: 2.4 mg/dL (ref 1.7–2.4)

## 2022-03-28 MED ORDER — POTASSIUM CHLORIDE CRYS ER 20 MEQ PO TBCR
40.0000 meq | EXTENDED_RELEASE_TABLET | Freq: Once | ORAL | Status: AC
  Administered 2022-03-28: 40 meq via ORAL
  Filled 2022-03-28: qty 2

## 2022-03-28 MED ORDER — MEGESTROL ACETATE 20 MG PO TABS
160.0000 mg | ORAL_TABLET | Freq: Two times a day (BID) | ORAL | Status: DC
  Administered 2022-03-29 – 2022-04-01 (×8): 160 mg via ORAL
  Filled 2022-03-28 (×12): qty 8

## 2022-03-28 MED ORDER — SENNOSIDES-DOCUSATE SODIUM 8.6-50 MG PO TABS
2.0000 | ORAL_TABLET | Freq: Two times a day (BID) | ORAL | Status: DC
  Administered 2022-03-28 – 2022-04-02 (×9): 2 via ORAL
  Filled 2022-03-28 (×10): qty 2

## 2022-03-28 MED ORDER — POTASSIUM CHLORIDE 20 MEQ PO PACK
40.0000 meq | PACK | Freq: Once | ORAL | Status: DC
  Filled 2022-03-28: qty 2

## 2022-03-28 NOTE — Progress Notes (Signed)
Nutrition Follow-up  DOCUMENTATION CODES:   Non-severe (moderate) malnutrition in context of chronic illness, Underweight  INTERVENTION:   -Continue Ensure Enlive po BID, each supplement provides 350 kcal and 20 grams of protein -Continue Magic cup TID with meals, each supplement provides 290 kcal and 9 grams of protein  -Continue MVI with minerals daily  NUTRITION DIAGNOSIS:   Moderate Malnutrition related to chronic illness (autism) as evidenced by mild fat depletion, mild muscle depletion, moderate muscle depletion.  Ongoing  GOAL:   Patient will meet greater than or equal to 90% of their needs  Progressing   MONITOR:   PO intake, Supplement acceptance, Diet advancement  REASON FOR ASSESSMENT:   Consult Assessment of nutrition requirement/status  ASSESSMENT:   Pt admitted from [facility] with syncope. PMH significant for learning disability, autism, nonverbal at baseline and seizure disorder.  6/29- s/p BSE- dysphagia 1 diet   Reviewed I/O's: +1 L x 24 hours and +2.1 L since admission   Pt sitting up in bed, nonverbal. Per sitter and RN, pt's oral intake has improved. Noted meal completions 10-30%. Pt is eating Magic Cups, but acceptance of Ensure is inconsistent. Per RN, pt has been taking PO meds very poorly and has been mixing them in ice cream to increase acceptance.   Medications reviewed and include remeron and senokot.   Labs reviewed: Na: 146.    NUTRITION - FOCUSED PHYSICAL EXAM:  Flowsheet Row Most Recent Value  Orbital Region Mild depletion  Upper Arm Region Mild depletion  Thoracic and Lumbar Region Mild depletion  Buccal Region Mild depletion  Temple Region Mild depletion  Clavicle Bone Region Mild depletion  Clavicle and Acromion Bone Region Moderate depletion  Scapular Bone Region Moderate depletion  Dorsal Hand Moderate depletion  Patellar Region Mild depletion  Anterior Thigh Region Mild depletion  Posterior Calf Region Mild  depletion  Edema (RD Assessment) None  Hair Reviewed  Eyes Reviewed  Mouth Reviewed  Skin Reviewed  Nails Reviewed       Diet Order:   Diet Order             DIET - DYS 1 Room service appropriate? No; Fluid consistency: Thin  Diet effective now                   EDUCATION NEEDS:   No education needs have been identified at this time  Skin:  Skin Assessment: Reviewed RN Assessment  Last BM:  PTA  Height:   Ht Readings from Last 1 Encounters:  03/26/22 6' (1.829 m)    Weight:   Wt Readings from Last 1 Encounters:  03/26/22 53.3 kg    Ideal Body Weight:  80.9 kg  BMI:  Body mass index is 15.94 kg/m.  Estimated Nutritional Needs:   Kcal:  2000-2200  Protein:  100-115g  Fluid:  >/=2L    Levada Schilling, RD, LDN, CDCES Registered Dietitian II Certified Diabetes Care and Education Specialist Please refer to [facility] for RD and/or RD on-call/weekend/after hours pager

## 2022-03-28 NOTE — TOC Progression Note (Signed)
Transition of Care [facility]) - Progression Note    Patient Details  Name: Mario Mason MRN: [id number] Date of Birth: 1966/09/16  Transition of Care [facility]) CM/SW Contact  Caryn Section, RN Phone Number: 03/28/2022, 4:25 PM  Clinical Narrative:   RNCM spoke with Ulyess Blossom, RN who states that she will only require 1-2 hour notification prior to discharge  [phone], and she will require FL2, which has been started, at discharge.  She states to please contact her with any questions.  Patient's MD is Lynnell Grain [phone]         Expected Discharge Plan and Services                                                 Social Determinants of Health (SDOH) Interventions    Readmission Risk Interventions     No data to display

## 2022-03-28 NOTE — Progress Notes (Addendum)
  Progress Note   Patient: Mario Mason UPJ:[id number] DOB: Nov 16, 1965 DOA: 03/26/2022     1 DOS: the patient was seen and examined on 03/28/2022   Brief hospital course: Mario Mason is a 56 year old male with history of learning disability, autism, nonverbal at baseline, seizure disorder, who presents emergency department from [facility] for chief concerns of syncopal events. Patient has not been eating for 2 weeks.  He has a severe hypernatremia.  Upon arriving the hospital, patient is seen by speech therapy, is started eating small amount of diet.  Added megestrol.  Patient also received D5 water for hypernatremia.  Sodium level is better.  Assessment and Plan: Syncope secondary to dehydration. Severe hypernatremia secondary to dehydration Severe protein calorie malnutrition secondary to poor appetite. Anorexia. Spoke with by Designer, television/film set, patient started to eat.  However, patient refused liquid medicine, changed megestrol to tablets form. Patient has not had had a recorded bowel movement, change senna to 2 tablets twice a day.   Sodium level much improved after giving D5 water. Patient seem to have some dysphagia, obtain ENT consult.    Seizure disorder. Patient did not have any seizure activity recently.   Mental retardation. Autism. He is seen by psychiatry for behavior abnormality.      Subjective:  Patient appears comfortable, started eating.  Physical Exam: Vitals:   03/27/22 1217 03/27/22 1951 03/27/22 2043 03/28/22 0934  BP: 140/89 114/73  135/80  Pulse: 84 (!) 115 (!) 109 (!) 108  Resp:    18  Temp: 98.4 F (36.9 C)     TempSrc:      SpO2: 100% (!) 81% 100% 100%  Weight:      Height:       General exam: Appears calm and severely malnourished. Respiratory system: Clear to auscultation. Respiratory effort normal. Cardiovascular system: S1 & S2 heard, RRR. No JVD, murmurs, rubs, gallops or clicks. No pedal edema. Gastrointestinal system:  Abdomen is nondistended, soft and nontender. No organomegaly or masses felt. Normal bowel sounds heard. Central nervous system: Alert and nonverbal Extremities: Significant muscle atrophy Skin: No rashes, lesions or ulcers    Data Reviewed:  Lab results reviewed.  Family Communication: None  Disposition: Status is: Inpatient Remains inpatient appropriate because: Severity of disease.  Planned Discharge Destination:  Group home    Time spent: 35 minutes  Author: Marrion Coy, MD 03/28/2022 12:58 PM  For on call review [email or web address].

## 2022-03-29 DIAGNOSIS — E43 Unspecified severe protein-calorie malnutrition: Secondary | ICD-10-CM | POA: Diagnosis not present

## 2022-03-29 DIAGNOSIS — E870 Hyperosmolality and hypernatremia: Secondary | ICD-10-CM | POA: Diagnosis not present

## 2022-03-29 DIAGNOSIS — R55 Syncope and collapse: Secondary | ICD-10-CM | POA: Diagnosis not present

## 2022-03-29 DIAGNOSIS — F79 Unspecified intellectual disabilities: Secondary | ICD-10-CM | POA: Diagnosis not present

## 2022-03-29 NOTE — Consult Note (Signed)
Mario Mason, Cruise [id number] [birth year] Marrion Coy, MD  Reason for Consult: Dysphagia, weight loss  HPI: Patient is a 56 year old mentally disabled man who presented to hospital for syncope.  He has not been eating for quite some time and per records has a 60 pound weight loss.  Per notes, he points to his throat at times when eating.  Evaluated by speech therapy and tolerated some PO but per records brought therapist's hand to his throat during trials.  Patient is non-verbal and information is limited to the sitter and records.  Patient denies any pain or discomfort when asked but unclear with understanding.  Allergies: No Known Allergies  ROS: Review of systems normal other than 12 systems except per HPI.  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  SH:  Social History   Socioeconomic History   Marital status: Single    Spouse name: Not on file   Number of children: Not on file   Years of education: Not on file   Highest education level: Not on file  Occupational History   Not on file  Tobacco Use   Smoking status: Never   Smokeless tobacco: Never  Vaping Use   Vaping Use: Never used  Substance and Sexual Activity   Alcohol use: Never   Drug use: Never   Sexual activity: Not on file  Other Topics Concern   Not on file  Social History Narrative   Not on file   Social Determinants of Health   Financial Resource Strain: Not on file  Food Insecurity: Not on file  Transportation Needs: Not on file  Physical Activity: Not on file  Stress: Not on file  Social Connections: Not on file  Intimate Partner Violence: Not on file    PSH:  Past Surgical History:  Procedure Laterality Date   CATARACT EXTRACTION W/ INTRAOCULAR LENS IMPLANT  2013   both eyes done at different times   DG TEETH FULL     at [facility]    ESOPHAGOGASTRODUODENOSCOPY (EGD) WITH PROPOFOL N/A 02/03/2018   Procedure: ESOPHAGOGASTRODUODENOSCOPY (EGD) WITH PROPOFOL;  Surgeon: Toledo, Boykin Nearing, MD;  Location: [facility] ENDOSCOPY;  Service: Gastroenterology;  Laterality: N/A;    Physical  Exam:  GEN-  NAD, sitting upright in bed with legs crossed, non-verbal. NEURO-  CN 2-12 grossly intact and symmetric, unable to test CN VIII-XII but observed movements of tongue and hearing appeared WNL OC/OP-  tongue mobility and lip movement WNL, edentulous.  Patient did not cooperate with oropharyngeal exam.  Tolerating secretions. EARS- external ears clear NOSE-  anteriorly clear but patient would no cooperate with more posterior exam NECK-  no masses or lesions, thyroid WNL.  No LAD, normal landmarks RESP- unlabored, no stridor, no increased use of accessory muscles CARD-  RRR  Trans-nasal flexible laryngoscopy-  Patient did not cooperate and unable to preform   A/P: Weight loss, possible dysphagia  Plan:  Reviewed prior records and speech notes.  Patient is able to tolerate PO but appears to try and communicate something at times and per speech has had a 60 lb weight loss.  Unclear if this is functional or if there a pathology associated with it.  Normal ENT exam but unable to perform laryngoscopy.  Previously had EGD in 2019 that showed hiatal hernia and reflux.  I do not think patient would cooperate with CT scan.  Unclear if he would cooperate for MBSS or barium swallow.  If concern persists for dysphagia, recommend GI consultation to see if they recommend a repeat EGD.  Please re-consult if any questions or concerns.   Mario Mason 03/29/2022 7:10 AM

## 2022-03-29 NOTE — Progress Notes (Addendum)
  Progress Note   Patient: Mario Mason BDZ:[id number] DOB: 1966/02/01 DOA: 03/26/2022     2 DOS: the patient was seen and examined on 03/29/2022   Brief hospital course: [name] is a 56 year old male with history of learning disability, autism, nonverbal at baseline, seizure disorder, who presents emergency department from [facility] for chief concerns of syncopal events. Patient has not been eating for 2 weeks.  He has a severe hypernatremia.  Upon arriving the hospital, patient is seen by speech therapy, is started eating small amount of diet.  Added megestrol.  Patient also received D5 water for hypernatremia.  Sodium level is better.  Assessment and Plan: Syncope secondary to dehydration. Severe hypernatremia secondary to dehydration Severe protein calorie malnutrition secondary to poor appetite. Anorexia. Discussed with the nurse, patient was only able to take in some ice creams, applesauce and Magic cups.  Has been refusing anything else. Dr. Andee Poles from ENT seen the patient, could not perform a scope due to patient refusing. I also started megestrol tablets, patient really is starting taking it this morning.  At this point, I will continue to monitor him to see if his appetite can improve. Discussed with patient legal guardian, patient has been point to his throat for 25 years.  He probably does not have any pharyngeal abnormality.  Legal guardian is agreeable for feeding tube placement if he does not improve.  As result, will continue megestrol, which the patient just started taking today based on MARs.      Seizure disorder. Patient did not have any seizure activity recently.   Mental retardation. Autism. He is seen by psychiatry for behavior abnormality.        Subjective:  Patient is nonverbal, does not seem to be in any discomfort  Physical Exam: Vitals:   03/28/22 1600 03/28/22 1900 03/29/22 0627 03/29/22 0800  BP: 126/68 135/74 (!) 95/54 105/65   Pulse: 88 98 85 70  Resp: 18 17 17 14   Temp: 98 F (36.7 C) 98 F (36.7 C) 98 F (36.7 C) (!) 97.4 F (36.3 C)  TempSrc: Axillary Axillary Axillary Axillary  SpO2: 100% 95% 99% (!) 85%  Weight:      Height:       General exam: Appears calm and comfortable, severely malnourished Respiratory system: Clear to auscultation. Respiratory effort normal. Cardiovascular system: S1 & S2 heard, RRR. No JVD, murmurs, rubs, gallops or clicks. No pedal edema. Gastrointestinal system: Abdomen is nondistended, soft and nontender. No organomegaly or masses felt. Normal bowel sounds heard. Central nervous system: Alert and nonverbal. No focal neurological deficits. Extremities: Significant muscle atrophy. Skin: No rashes, lesions or ulcers   Data Reviewed:  No new labs today.  Family Communication: Not able to reach guardian.  Disposition: Status is: Inpatient Remains inpatient appropriate because: Severity of disease,  Planned Discharge Destination:  Group home    Time spent: 55 minutes  Author: , MD 03/29/2022 11:44 AM  For on call review [email or web address].

## 2022-03-29 NOTE — Progress Notes (Signed)
Resident with poor appetite throughout the shift. Refuses Ensure. He does like to to eat apple sauce, magic cup and ice cream. Continues to require frequent cues and to be redirected. At risk to wander. Sitter 1:1 continues.

## 2022-03-30 DIAGNOSIS — E43 Unspecified severe protein-calorie malnutrition: Secondary | ICD-10-CM | POA: Diagnosis not present

## 2022-03-30 DIAGNOSIS — R55 Syncope and collapse: Secondary | ICD-10-CM | POA: Diagnosis not present

## 2022-03-30 DIAGNOSIS — F79 Unspecified intellectual disabilities: Secondary | ICD-10-CM | POA: Diagnosis not present

## 2022-03-30 DIAGNOSIS — E870 Hyperosmolality and hypernatremia: Secondary | ICD-10-CM | POA: Diagnosis not present

## 2022-03-30 LAB — BASIC METABOLIC PANEL
Anion gap: 7 (ref 5–15)
BUN: 15 mg/dL (ref 6–20)
CO2: 23 mmol/L (ref 22–32)
Calcium: 8.9 mg/dL (ref 8.9–10.3)
Chloride: 109 mmol/L (ref 98–111)
Creatinine, Ser: 0.95 mg/dL (ref 0.61–1.24)
GFR, Estimated: >60 mL/min (ref >60)
Glucose, Bld: 104 mg/dL — ABNORMAL HIGH (ref 70–99)
Potassium: 3.8 mmol/L (ref 3.5–5.1)
Sodium: 139 mmol/L (ref 135–145)

## 2022-03-30 LAB — PHOSPHORUS: Phosphorus: 6.1 mg/dL — ABNORMAL HIGH (ref 2.5–4.6)

## 2022-03-30 LAB — MAGNESIUM: Magnesium: 2.6 mg/dL — ABNORMAL HIGH (ref 1.7–2.4)

## 2022-03-30 NOTE — Progress Notes (Signed)
  Progress Note   Patient: Mario Mason ZOX:[id number] DOB: 10-Jul-1966 DOA: 03/26/2022     3 DOS: the patient was seen and examined on 03/30/2022   Brief hospital course: Mr. Franklyn Cafaro is a 56 year old male with history of learning disability, autism, nonverbal at baseline, seizure disorder, who presents emergency department from [facility] for chief concerns of syncopal events. Patient has not been eating for 2 weeks.  He has a severe hypernatremia.  Upon arriving the hospital, patient is seen by speech therapy, is started eating small amount of diet.  Added megestrol.  Patient also received D5 water for hypernatremia.  Sodium level is better.  Assessment and Plan: Syncope secondary to dehydration. Severe hypernatremia secondary to dehydration Severe protein calorie malnutrition secondary to poor appetite. Anorexia. Dr. Andee Poles from ENT seen the patient, could not perform a scope due to patient refusing. Patient has not been eating for weeks.  Since admission to the hospital, patient was only eating applesauce and Magic cups, not enough calories to sustain life.  I started megestrol tablets, patient remarks, patient was able to take it since yesterday.  Patient still has not increase his intake. Discussed with patient legal guardian, he requested a feeding tube placement.     Seizure disorder. Patient did not have any seizure activity recently.   Mental retardation. Autism. He is seen by psychiatry for behavior abnormality.        Subjective:  Patient was able to drink some liquid and ice cream and Magic cup.  Still refusing any food or Ensure.  Physical Exam: Vitals:   03/29/22 0800 03/29/22 1700 03/29/22 1957 03/30/22 0900  BP: 105/65 (!) 130/91 132/72 (!) 88/75  Pulse: 70 97 95 97  Resp: 14 18 20 18   Temp: (!) 97.4 F (36.3 C) (!) 97.5 F (36.4 C)  (!) 97.5 F (36.4 C)  TempSrc: Axillary Axillary  Axillary  SpO2: (!) 85%  99% 97%  Weight:      Height:        General exam: Appears calm and comfortable, severely malnourished Respiratory system: Clear to auscultation. Respiratory effort normal. Cardiovascular system: S1 & S2 heard, RRR. No JVD, murmurs, rubs, gallops or clicks. No pedal edema. Gastrointestinal system: Abdomen is nondistended, soft and nontender. No organomegaly or masses felt. Normal bowel sounds heard. Central nervous system: Alert and nonverbal.  Extremities: Symmetric 5 x 5 power. Skin: No rashes, lesions or ulcers  Data Reviewed:  Lab results reviewed  Family Communication: Discussed with patient legal guardian.  Disposition: Status is: Inpatient Remains inpatient appropriate because: Severity of disease.  Planned Discharge Destination:  Group home    Time spent: 35 minutes  Author: , MD 03/30/2022 12:53 PM  For on call review [email or web address].

## 2022-03-31 DIAGNOSIS — E43 Unspecified severe protein-calorie malnutrition: Secondary | ICD-10-CM | POA: Diagnosis not present

## 2022-03-31 DIAGNOSIS — F79 Unspecified intellectual disabilities: Secondary | ICD-10-CM | POA: Diagnosis not present

## 2022-03-31 DIAGNOSIS — R4701 Aphasia: Secondary | ICD-10-CM | POA: Diagnosis not present

## 2022-03-31 DIAGNOSIS — R55 Syncope and collapse: Secondary | ICD-10-CM | POA: Diagnosis not present

## 2022-03-31 MED ORDER — LACTATED RINGERS IV SOLN: Freq: Once | INTRAVENOUS | Status: AC

## 2022-03-31 NOTE — Progress Notes (Signed)
Pt has not voided since 0730. Sitter has attempted to get pt OOB several times to go, but pt has refused. Bladder scan revealed . MD made aware.

## 2022-03-31 NOTE — Progress Notes (Signed)
Pts aunt and cousin are in room at this time. They state that the person that is claimed to be the legal guardian for this pt is NOT the guardian. [name] (in chart as guardian) is the pts brother in law. His wife was POA/guardian, but she passed away last year. There is currently no POA or guardian legally that anyone is aware of. I don't see any paperwork stating POA or guardianship either. He does have another sister and a brother that are still living. They brother is apparently easier to get in touch with. His name is Mario Mason "Mario Mason" Mario Mason [phone]. I will add his name and number to the chart as well. MD and CSW Clarisse Gouge made aware via secure chat.

## 2022-03-31 NOTE — Progress Notes (Addendum)
  Progress Note   Patient: Mario Mason:[id number] DOB: 09-16-1966 DOA: 03/26/2022     4 DOS: the patient was seen and examined on 03/31/2022   Brief hospital course: [name] is a 55 year old male with history of learning disability, autism, nonverbal at baseline, seizure disorder, who presents emergency department from [facility] for chief concerns of syncopal events. Patient has not been eating for 2 weeks.  He has a severe hypernatremia.  Upon arriving the hospital, patient is seen by speech therapy, is started eating small amount of diet.  Added megestrol.  Patient also received D5 water for hypernatremia.  Sodium level is better.  Assessment and Plan: Syncope secondary to dehydration. Severe hypernatremia secondary to dehydration Severe protein calorie malnutrition secondary to poor appetite. Anorexia. Dr. Andee Poles from ENT seen the patient, could not perform a scope due to patient refusing. Patient is able to drink fluids, but also has been refusing solid food and Ensure.  He no longer is dehydrated, but not eating sufficient food to sustain life.  Legal guardian wanted a feeding tube placement.  Currently pending GI consult.  Continue megestrol.     Seizure disorder. Patient did not have any seizure activity recently.   Mental retardation. Autism. He is seen by psychiatry for behavior abnormality.   I was notified by social service, patient does not have legal guardian at this time.  We cannot get a consent for PEG tube.  I will cancel GI consult.     Subjective:  Patient is able to drink coffee and water.  He was also eating Magic Cup, coffee and ice creams.  Refusing any other foods.  Physical Exam: Vitals:   03/30/22 0900 03/30/22 1654 03/30/22 2036 03/31/22 0923  BP: (!) 88/75 (!) 142/97 (!) 120/99 137/80  Pulse: 97 (!) 108 80 89  Resp: 18 20 16 20   Temp: (!) 97.5 F (36.4 C) 97.6 F (36.4 C) 98 F (36.7 C) 97.7 F (36.5 C)  TempSrc:  Axillary Axillary Oral   SpO2: 97% 100% 94%   Weight:      Height:       General exam: Appears calm and comfortable, severely malnourished Respiratory system: Clear to auscultation. Respiratory effort normal. Cardiovascular system: S1 & S2 heard, RRR. No JVD, murmurs, rubs, gallops or clicks. No pedal edema. Gastrointestinal system: Abdomen is nondistended, soft and nontender. No organomegaly or masses felt. Normal bowel sounds heard. Central nervous system: Alert and nonverbal Extremities: Significant muscle atrophy Skin: No rashes, lesions or ulcers   Data Reviewed:  There are no new results to review at this time.  Family Communication:   Disposition: Status is: Inpatient Remains inpatient appropriate because: Severity of disease.  Planned Discharge Destination:  Group home    Time spent: 26 minutes  Author: , MD 03/31/2022 12:09 PM  For on call review [email or web address].

## 2022-03-31 NOTE — Progress Notes (Signed)
Speech Language Pathology Treatment: Dysphagia  Patient Details Name: Mario Mason MRN: [id number] DOB: 1966/07/04 Today's Date: 03/31/2022 Time: 4098-1191 SLP Time Calculation (min) (ACUTE ONLY): 45 min  Assessment / Plan / Recommendation Clinical Impression  Pt seen for ongoing assessment of swallowing; Family present prior to session wished for pt to have a more "Regular" diet/foods stating he had been eating "Regular" foods "before". Family stated they were unsure of his diet and what he ate at the [facility] but that ~1-2 years ago, he had eaten "Regular" foods when he "lived at home" w/ other family.  Pt has a Baseline of Intellectual Disability and is Nonverbal. Unsure of pt's degree of comprehension or carry-over and follow-through w/ new/learned information.  Pt was shown the trial meal of chopped spaghetti and reached for the spoon to feed himself, then pointed to a cup of Soda.   He continued to feed himself Dinner meal of purees, chopped/minced solids, and thin liquids via Cup. NO overt clinical s/s of aspiration were noted w/ any consistency; respiratory status remained calm and unlabored, no coughing b/t trials. Oral phase appeared grossly [facility] for bolus management and timely A-P transfer for swallowing of thins and purees -- HOWEVER, pt exhibited IMPULSIVE Feeding Behavior w/ the chopped/minced solids c/b little to no chewing of the foods b/f swallowing. He fed himself consecutive bites finishing the plate of spaghetti w/in ~3-4 mins. Oral clearing achieved w/ all consistencies. NSG denied any deficits in swallowing of liquids and purees, though pt mostly eats the Magic Cup ice cream and little of the Puree foods.   Suspect pt's Impulsive eating behavior could be why he was put on a Pureed diet at the Group Home but not liking the Puree, he does not eat much -- pt has had weight loss in recent months per chart notes.  A solid foods diet cannot be recommended by ST services  d/t the fact that he swallows the foods w/out chewing -- a choking hazard. Will defer to MD and Family to make decisions moving forward re: his diet consistency.   Pt appears at reduced risk for aspriation when following general aspiration precautions. Recommend continue current pureed diet w/ gravies added to moisten foods; Thin liquids. Recommend general aspiration precautions; Pills Crushed in Puree; tray setup and positioning assistance for meals. REFLUX precautions d/t pt's baseline. ST services will await MD's/family's decision and provide education to Staff. NSG updated. Precautions posted at bedside.      HPI HPI: Pt is a 56 year old male with history of  intellectual disability, autism, nonverbal at baseline, seizure disorder, who presents emergency department from [facility] for chief concerns of syncopal events.  Patient has not been eating well for 2+ weeks refusing any medication or solid food but will sip liquid -- he gestures/communicates he has a "sore throat". The group home was familiar with him stated he is on a Puree diet there and reports that he has lost 20-30 pounds in the past 6 months. He has a severe hypernatremia at admit, malnutrition.   OF NOTE: Pt has been seen by his PCP and recommended f/u w/ ENT(see PCP note on 03/21/2022).  Per chart, pt had a similar episode w/ weight loss, sore throat, and not eating in 2019 w/ EGD completed then.     Chest Imaging: No evidence of acute cardiopulmonary abnormality; patchy density in right base suggesting atelectasis or scarring. Unsure if pt has REFLUX or not.  SLP Plan  Goals updated (Next diet goal TBD by MD/family)      Recommendations for follow up therapy are one component of a multi-disciplinary discharge planning process, led by the attending physician.  Recommendations may be updated based on patient status, additional functional criteria and insurance authorization.    Recommendations  Diet  recommendations: Dysphagia 1 (puree);Thin liquid Liquids provided via: Cup Medication Administration: Crushed with puree Supervision: Patient able to self feed;Full supervision/cueing for compensatory strategies Compensations: Minimize environmental distractions;Slow rate;Small sips/bites;Follow solids with liquid (Time b/t bites/sips) Postural Changes and/or Swallow Maneuvers: Out of bed for meals;Seated upright 90 degrees;Upright 30-60 min after meal                General recommendations:  (Dietician f/u; Behavioral specialist/education f/u) Oral Care Recommendations: Oral care BID;Oral care before and after PO;Staff/trained caregiver to provide oral care Follow Up Recommendations: No SLP follow up Assistance recommended at discharge: Frequent or constant Supervision/Assistance (baseline) SLP Visit Diagnosis: Dysphagia, oral phase (R13.11) (impulsive; reduced awareness/insight) Plan: Goals updated (Next diet goal TBD by MD/family)             Jerilynn Som, MS, CCC-SLP Speech Language Pathologist Rehab Services; [facility] [place] [phone] (ascom)  [name]  03/31/2022, 5:43 PM

## 2022-04-01 DIAGNOSIS — R55 Syncope and collapse: Secondary | ICD-10-CM | POA: Diagnosis not present

## 2022-04-01 DIAGNOSIS — E870 Hyperosmolality and hypernatremia: Secondary | ICD-10-CM | POA: Diagnosis not present

## 2022-04-01 DIAGNOSIS — F79 Unspecified intellectual disabilities: Secondary | ICD-10-CM | POA: Diagnosis not present

## 2022-04-01 NOTE — Plan of Care (Signed)

## 2022-04-01 NOTE — Plan of Care (Signed)
Pt has voided 4x since beginning of shift and has also ambulated to the BR 3 of those times with only minimal assist.  Pt ate 2 chocolate puddings and 1 magic cup.  Took his medication with encouragement of getting another pudding. He has been cooperative and can clearly communicate wants - although nonverbally.

## 2022-04-01 NOTE — Progress Notes (Signed)
SLP F/U Note  Patient Details Name: Mario Mason MRN: [id number] DOB: 1965-12-23   Cancelled treatment:       Reason Eval/Treat Not Completed:  (chart reviewed; consulted MD re: pt's diet status and POC moving forward.) Per MD and chart notes, patient normally eats certain foods well -- foods w/ more texture(baseline for pt per Family). Chopped spaghetti was trialed yesterday -- see tx note. There is risk for choking/aspiration d/t pt not chewing foods well b/f swallowing. MD reached out to pt's brother, [name], to discuss pt's FTT and option of feeding tube vs diet upgrade w/ understanding of risk. MD spoke with other Family again today who will reach out to patient's brother to make a decision. Per MD/chart notes:   "Addendum: 1500. Able to reach patient's brother, we discussed options as above, [name] has made a decision to have diet advanced.  He is aware of the risk, waiting to take the risk to avoid a feeding tube.".   This clinician upgraded pt's diet to the Dysphagia level 2 (MINCED foods w/ added Purees) w/ comments; aspiration precautions posted in room and in chart to follow in order in hopes to lessen risk for choking/aspiration and to monitor pt w/ oral intake. NSG and Sitter to monitor pt during meals; NSG updated and agreed.  NSG to f/u w/ MD w/ any further needs. ST services to sign off at this time.       Jerilynn Som, MS, CCC-SLP Speech Language Pathologist Rehab Services; [facility] [phone] (ascom) Jenesis Suchy 04/01/2022, 4:14 PM

## 2022-04-01 NOTE — Progress Notes (Addendum)
Progress Note   Patient: Mario Mason:[id number] DOB: Aug 21, 1966 DOA: 03/26/2022     5 DOS: the patient was seen and examined on 04/01/2022   Brief hospital course: [name] is a 56 year old male with history of learning disability, autism, nonverbal at baseline, seizure disorder, who presents emergency department from [name] for chief concerns of syncopal events. Patient has not been eating for 2 weeks.  He has a severe hypernatremia.  Upon arriving the hospital, patient is seen by speech therapy, is started eating small amount of diet.  Added megestrol.  Patient also received D5 water for hypernatremia.  Sodium level is better.  Assessment and Plan: Syncope secondary to dehydration. Severe hypernatremia secondary to dehydration Severe protein calorie malnutrition secondary to poor appetite. Anorexia. [name] from ENT seen the patient, could not perform a scope due to patient refusing. Patient is also placed on megestrol tablets which he is able to take. Currently, patient is able to taking liquid, Magic cup, and ice creams.  He refused anything else. Met with the patient cousin and aunt yesterday, the initial person listed as a legal guardian was not true, family does not want to have his involvement. Per family, patient recently was seen by speech therapist, he was a placed on pured diet at that time.  After that, patient refused to eat. Patient normally loves spaghetti.  We tried it with speech therapist with chopped spaghetti, patient was able to eat.  However, this will increase the risk of aspiration.  The alternative will be a feeding tube. Patient next of kin is his brother, Mario Mason, we are trying to reach him, if he agrees, we can change patient diet to a dysphagia 2 diet with chopped food to avoid a feeding tube.  I have spoken with the patient cousin again today, she will reach out to patient brother to make a decision.  Addendum: 1500. Able to  reach patient's brother, we discussed options as above, Mario Mason has made a decision to have diet advanced.  He is aware of the risk, waiting to take the risk to avoid a feeding tube.  Addendum: 1115 I was notified by Education officer, museum, there is legal guardian paperwork presented by Mario Mason.  I have discussed with Mario Mason, went through the above options for treatment, we decided that it is appropriate to advance patient diet in order to avoid a tube feeding.   Seizure disorder. Patient did not have any seizure activity recently.   Mental retardation. Autism. He is seen by psychiatry for behavior abnormality.        Subjective:  Patient is able to drink coffee in the liquid, but eating very little. He received some fluid bolus for decreased urine output yesterday.  Physical Exam: Vitals:   03/31/22 1940 04/01/22 0420 04/01/22 0922 04/01/22 1124  BP: (!) 130/98 134/85 (!) 142/89 115/83  Pulse: (!) 110 (!) 102 (!) 108 (!) 105  Resp: 16 16 15 17   Temp: 98.1 F (36.7 C) 97.6 F (36.4 C) 97.7 F (36.5 C) 98.6 F (37 C)  TempSrc: Oral Oral  Oral  SpO2: 100% 99% 100% (!) 85%  Weight:      Height:       General exam: Appears calm and comfortable, severely malnourished. Respiratory system: Clear to auscultation. Respiratory effort normal. Cardiovascular system: S1 & S2 heard, RRR. No JVD, murmurs, rubs, gallops or clicks. No pedal edema. Gastrointestinal system: Abdomen is nondistended, soft and nontender. No organomegaly or masses  felt. Normal bowel sounds heard. Central nervous system: Alert and nonverbal Extremities: Significant muscle atrophy Skin: No rashes, lesions or ulcers   Data Reviewed:  Lab results reviewed again  Family Communication: Discussed with patient cousin.  Disposition: Status is: Inpatient Remains inpatient appropriate because: Severity of disease,  Planned Discharge Destination:  Group home    Time spent: 35 minutes  Author: Sharen Hones, MD 04/01/2022 2:10 PM  For on call review [email or web address].

## 2022-04-02 ENCOUNTER — Encounter: Payer: Self-pay | Admitting: Internal Medicine

## 2022-04-02 DIAGNOSIS — E870 Hyperosmolality and hypernatremia: Secondary | ICD-10-CM | POA: Diagnosis not present

## 2022-04-02 DIAGNOSIS — E43 Unspecified severe protein-calorie malnutrition: Secondary | ICD-10-CM | POA: Diagnosis not present

## 2022-04-02 DIAGNOSIS — E440 Moderate protein-calorie malnutrition: Secondary | ICD-10-CM | POA: Insufficient documentation

## 2022-04-02 DIAGNOSIS — F79 Unspecified intellectual disabilities: Secondary | ICD-10-CM | POA: Diagnosis not present

## 2022-04-02 DIAGNOSIS — R55 Syncope and collapse: Secondary | ICD-10-CM | POA: Diagnosis not present

## 2022-04-02 LAB — BASIC METABOLIC PANEL
Anion gap: 5 (ref 5–15)
BUN: 11 mg/dL (ref 6–20)
CO2: 24 mmol/L (ref 22–32)
Calcium: 9.3 mg/dL (ref 8.9–10.3)
Chloride: 110 mmol/L (ref 98–111)
Creatinine, Ser: 0.93 mg/dL (ref 0.61–1.24)
GFR, Estimated: >60 mL/min (ref >60)
Glucose, Bld: 110 mg/dL — ABNORMAL HIGH (ref 70–99)
Potassium: 4.2 mmol/L (ref 3.5–5.1)
Sodium: 139 mmol/L (ref 135–145)

## 2022-04-02 LAB — MAGNESIUM: Magnesium: 2.4 mg/dL (ref 1.7–2.4)

## 2022-04-02 LAB — PHOSPHORUS: Phosphorus: 5.2 mg/dL — ABNORMAL HIGH (ref 2.5–4.6)

## 2022-04-02 MED ORDER — ACETAMINOPHEN 325 MG PO CAPS: 650.0000 mg | ORAL_CAPSULE | Freq: Every day | ORAL | Status: AC | PRN

## 2022-04-02 MED ORDER — POLYETHYLENE GLYCOL 3350 17 G PO PACK: 17.0000 g | PACK | Freq: Every day | ORAL | Status: AC | PRN

## 2022-04-02 NOTE — TOC Transition Note (Signed)
Transition of Care [facility]) - CM/SW Discharge Note   Patient Details  Name: TARRIS DELBENE MRN: [id number] Date of Birth: Feb 25, 1966  Transition of Care [facility]) CM/SW Contact:  Maree Krabbe, LCSW Phone Number: 04/02/2022, 4:26 PM   Clinical Narrative: All documents faxed to Ulyess Blossom at [facility]. Ulyess Blossom also talked to  [facility] with speech. Lupita Leash is ok with dc back to group home. Lupita Leash unable to provide transport. CSW spoke with pt's brother and he states he is unable to transport pt and thinks pt will need EMS. MD agrees on EMS transport. Transport arranged.           Patient Goals and CMS Choice        Discharge Placement                       Discharge Plan and Services                                     Social Determinants of Health (SDOH) Interventions     Readmission Risk Interventions     No data to display

## 2022-04-02 NOTE — Care Management Important Message (Signed)
Important Message  Patient Details  Name: ARTY LANTZY MRN: [id number] Date of Birth: 1965-12-03   Medicare Important Message Given:  Other (see comment)  RN on the unit let me know there is POA paperwork on the chart.  I have left a message with Tarri Glenn [phone] to review the Important Message from Medicare. Will await a return call.     [name] A Ariza Evans 04/02/2022, 2:23 PM

## 2022-04-02 NOTE — Discharge Summary (Addendum)
Physician Discharge Summary   Patient: Mario Mason MRN: [id number] DOB: May 03, 1966  Admit date:     03/26/2022  Discharge date: 04/02/22  Discharge Physician: Lurene Shadow   PCP: Lauro Regulus, MD   Recommendations at discharge:   Follow-up with PCP in 1 week  Discharge Diagnoses: Principal Problem:   Syncope Active Problems:   Nonverbal   Autism   Sinus tachycardia   Weight loss   Nutritional deficiency due to eating disorder   Refuses to eat   Protein-calorie malnutrition, severe (HCC)   Seizure disorder (HCC)   Hypernatremia   Thrombocytopenia (HCC)   Intellectual disability   Malnutrition of moderate degree  Resolved Problems:   * No resolved hospital problems. [facility] Course:  Mario Mason is a 56 year old male with history of learning disability, autism, nonverbal at baseline, seizure disorder, who was brought to the hospital [facility] because of syncope and poor oral intake.  Reportedly, he had not been eating for 2 weeks.  Apparently, he had been placed on pured diet by speech therapist, and he had refused to eat since then.  He was found to have severe hypernatremia from dehydration.  He was treated with IV fluids and hyponatremia have resolved.  He was evaluated by the dietitian and speech therapist.  Dysphagia 2 diet and thin liquids were recommended.  He has tolerated this diet thus far.  Dr. Andee Poles, otolaryngologist, was consulted for evaluation of dysphagia and weight loss.  Unfortunately, laryngoscopy could not be performed.  His condition has improved.  I saw him ambulating in his room with nurse tech at his side.  He is deemed stable for discharge to home today.  I was unable to reach [name], legal guardian, on his phone.  I left a voice message with him to call back.       Consultants: Otolaryngologist Procedures performed: None Disposition: Group home Diet recommendation:  Discharge Diet Orders (From  admission, onward)     Start     Ordered   04/02/22 0000  DIET DYS 2       Question:  Fluid consistency:  Answer:  Thin   04/02/22 1446           Dysphagia type 2 thin Liquid DISCHARGE MEDICATION: Allergies as of 04/02/2022   No Known Allergies      Medication List     STOP taking these medications    amoxicillin 400 MG/5ML suspension Commonly known as: AMOXIL   glycopyrrolate 1 MG tablet Commonly known as: ROBINUL       TAKE these medications    Acetaminophen 325 MG Caps Take 650 mg by mouth daily as needed. What changed:  when to take this reasons to take this   Advair Diskus 100-50 MCG/ACT Aepb Generic drug: fluticasone-salmeterol Inhale 1 puff into the lungs 2 (two) times daily.   amantadine 100 MG capsule Commonly known as: SYMMETREL Take 100 mg by mouth 2 (two) times daily.   azelastine 0.1 % nasal spray Commonly known as: ASTELIN Place 2 sprays into both nostrils in the morning and at bedtime.   famotidine 20 MG tablet Commonly known as: PEPCID Take 20 mg by mouth daily.   fluticasone 50 MCG/ACT nasal spray Commonly known as: FLONASE Place 2 sprays into both nostrils as needed for allergies or rhinitis.   hydrOXYzine 10 MG tablet Commonly known as: ATARAX Take 10 mg by mouth 3 (three) times daily.   loratadine 10 MG  tablet Commonly known as: CLARITIN Take 1 tablet by mouth daily as needed.   mirtazapine 30 MG tablet Commonly known as: REMERON Take 30 mg by mouth at bedtime.   montelukast 10 MG tablet Commonly known as: SINGULAIR Take 10 mg by mouth daily.   OXcarbazepine 300 MG/5ML suspension Commonly known as: TRILEPTAL Take 300 mg by mouth 2 (two) times daily.   polyethylene glycol 17 g packet Commonly known as: MIRALAX / GLYCOLAX Take 17 g by mouth daily as needed. What changed:  when to take this reasons to take this        Discharge Exam: Filed Weights   03/26/22 1354  Weight: 53.3 kg   GEN: NAD SKIN: No  rash EYES: EOMI ENT: MMM CV: RRR PULM: CTA B ABD: soft, ND, NT, +BS CNS: Alert but nonverbal, non focal EXT: No edema or tenderness   Condition at discharge: good  The results of significant diagnostics from this hospitalization (including imaging, microbiology, ancillary and laboratory) are listed below for reference.   Imaging Studies: DG Neck Soft Tissue  Result Date: 03/26/2022 CLINICAL DATA:  Dysphagia air EXAM: NECK SOFT TISSUES - 1+ VIEW COMPARISON:  None Available. FINDINGS: There is no evidence of retropharyngeal soft tissue swelling or epiglottic enlargement. The cervical airway is unremarkable and no radio-opaque foreign body identified. IMPRESSION: Negative. Electronically Signed   By: Helyn Numbers M.D.   On: 03/26/2022 20:53   CT Angio Chest PE W and/or Wo Contrast  Result Date: 03/26/2022 CLINICAL DATA:  Positive D-dimer.  Pulmonary embolism suspected. EXAM: CT ANGIOGRAPHY CHEST WITH CONTRAST TECHNIQUE: Multidetector CT imaging of the chest was performed using the standard protocol during bolus administration of intravenous contrast. Multiplanar CT image reconstructions and MIPs were obtained to evaluate the vascular anatomy. RADIATION DOSE REDUCTION: This exam was performed according to the departmental dose-optimization program which includes automated exposure control, adjustment of the mA and/or kV according to patient size and/or use of iterative reconstruction technique. CONTRAST:  22mL OMNIPAQUE IOHEXOL 350 MG/ML SOLN COMPARISON:  None Available. FINDINGS: Cardiovascular: Negative for pulmonary embolism. Ascending thoracic aorta measures 2.7 cm. Mild dilatation of the proximal descending thoracic aorta measuring up to 3.1 cm. Heart size is normal. No significant pericardial effusion. Mediastinum/Nodes: Thyroid tissue appears to be slightly enlarged and heterogeneous. Thyroid is incompletely imaged. No significant lymph node enlargement in the mediastinum or hila. No  axillary lymph node enlargement. Lungs/Pleura: The trachea is prominent for size. Trachea and mainstem bronchi are widely patent. No large pleural effusions. Few patchy densities in the posterior right lower lobe are nonspecific but favor atelectasis and/or scarring. 3 mm calcified granuloma in the periphery of the right lower lobe on sequence 5 image 71. 2 mm peripheral nodular density in the medial right lower lobe on image 48. No large areas of airspace disease or lung consolidation. Punctate nodule in the left lower lobe on image 88. Upper Abdomen: Gaseous distention of the colon anterior to the liver. Musculoskeletal: No acute bone abnormality. There is some curvature in the thoracic spine towards the right. Review of the MIP images confirms the above findings. IMPRESSION: 1. Negative for a pulmonary embolism. 2. Patchy densities in the posterior right lower lobe are suggestive for atelectasis or scarring. 3. Tiny pulmonary nodules measuring up to 2 mm. No follow-up needed if patient is low-risk (and has no known or suspected primary neoplasm). Non-contrast chest CT can be considered in 12 months if patient is high-risk. This recommendation follows the consensus statement: Guidelines for  Management of Incidental Pulmonary Nodules Detected on CT Images: From the Fleischner Society 2017; Radiology 2017; 284:228-243. 4. Mild dilatation of the proximal descending thoracic aorta measuring up to 3.1 cm. Electronically Signed   By: Richarda Overlie M.D.   On: 03/26/2022 16:38   DG Chest Portable 1 View  Result Date: 03/26/2022 CLINICAL DATA:  Provided history: Tachy, syncope. EXAM: PORTABLE CHEST 1 VIEW COMPARISON:  Report from chest radiographs 03/21/2022 (images unavailable). Chest radiograph 06/15/2014. FINDINGS: Heart size within normal limits. Gaseous distension of the splenic flexure of the colon with mild elevation of the left hemidiaphragm, similar to the prior chest radiograph of 06/15/2014. No appreciable  airspace consolidation or pulmonary edema. No evidence of pleural effusion or pneumothorax. Few small calcified granulomas within the right upper to mid lung field, unchanged. No acute bony abnormality identified. Thoracic dextrocurvature. IMPRESSION: No evidence of acute cardiopulmonary abnormality. Mild elevation of the left hemidiaphragm, unchanged. Thoracic dextrocurvature. Electronically Signed   By: Jackey Loge D.O.   On: 03/26/2022 14:53    Microbiology: Results for orders placed or performed during the hospital encounter of 03/26/22  SARS Coronavirus 2 by RT PCR (hospital order, performed in [facility] hospital lab) *cepheid single result test* Anterior Nasal Swab     Status: None   Collection Time: 03/26/22 12:20 PM   Specimen: Anterior Nasal Swab  Result Value Ref Range Status   SARS Coronavirus 2 by RT PCR NEGATIVE NEGATIVE Final    Comment: (NOTE) SARS-CoV-2 target nucleic acids are NOT DETECTED.  The SARS-CoV-2 RNA is generally detectable in upper and lower respiratory specimens during the acute phase of infection. The lowest concentration of SARS-CoV-2 viral copies this assay can detect is 250 copies / mL. A negative result does not preclude SARS-CoV-2 infection and should not be used as the sole basis for treatment or other patient management decisions.  A negative result may occur with improper specimen collection / handling, submission of specimen other than nasopharyngeal swab, presence of viral mutation(s) within the areas targeted by this assay, and inadequate number of viral copies (<250 copies / mL). A negative result must be combined with clinical observations, patient history, and epidemiological information.  Fact Sheet for Patients:   RoadLapTop.co.za  Fact Sheet for Healthcare Providers: http://kim-miller.com/  This test is not yet approved or  cleared by the [place] FDA and has been authorized for detection  and/or diagnosis of SARS-CoV-2 by FDA under an Emergency Use Authorization (EUA).  This EUA will remain in effect (meaning this test can be used) for the duration of the COVID-19 declaration under Section 564(b)(1) of the Act, 21 U.S.C. section 360bbb-3(b)(1), unless the authorization is terminated or revoked sooner.  Performed at [facility], [address] [place], [place] [address]     Labs: CBC: Recent Labs  Lab 03/27/22 0454  WBC 5.0  HGB 13.6  HCT 43.6  MCV 87.9  PLT 102*   Basic Metabolic Panel: Recent Labs  Lab 03/27/22 0454 03/28/22 0747 03/30/22 0621 04/02/22 0437  NA 152* 146* 139 139  K 3.9 3.5 3.8 4.2  CL 121* 113* 109 110  CO2 30 27 23 24   GLUCOSE 96 111* 104* 110*  BUN 31* 17 15 11   CREATININE 1.21 1.13 0.95 0.93  CALCIUM 8.5* 8.5* 8.9 9.3  MG  --  2.4 2.6* 2.4  PHOS  --  3.5 6.1* 5.2*   Liver Function Tests: No results for input(s): "AST", "ALT", "ALKPHOS", "BILITOT", "PROT", "ALBUMIN" in the last  168 hours. CBG: No results for input(s): "GLUCAP" in the last 168 hours.  Discharge time spent: greater than 30 minutes.  Signed: Lurene Shadow, MD [facility] 04/02/2022

## 2022-04-02 NOTE — [place] FL2 (Signed)
[place] MEDICAID FL2 LEVEL OF CARE SCREENING TOOL     IDENTIFICATION  Patient Name: Mario Mason Birthdate: 03/23/1966 Sex: male Admission Date (Current Location): 03/26/2022  [place] and [place] Number:  Chiropodist and Address:  [facility], [address] [place], [place] [address]      Provider Number: [id number]  Attending Physician Name and Address:  Lurene Shadow, MD  Relative Name and Phone Number:  Tarri Glenn (Legal Guardian)   [phone] (Mobile)    Current Level of Care: Hospital Recommended Level of Care: Other (Comment) (group home) Prior Approval Number:    Date Approved/Denied:   PASRR Number:    Discharge Plan: Other (Comment) (Group Home)    Current Diagnoses: Patient Active Problem List   Diagnosis Date Noted   Malnutrition of moderate degree 04/02/2022   Hypernatremia 03/27/2022   Thrombocytopenia (HCC) 03/27/2022   Intellectual disability 03/27/2022   Syncope 03/26/2022   Nonverbal 03/26/2022   Autism 03/26/2022   Sinus tachycardia 03/26/2022   Weight loss 03/26/2022   Nutritional deficiency due to eating disorder 03/26/2022   Refuses to eat 03/26/2022   Protein-calorie malnutrition, severe (HCC) 03/26/2022   Seizure disorder (HCC) 03/26/2022    Orientation RESPIRATION BLADDER Height & Weight      (UTA pt is non verbal)  Normal Continent Weight: 117 lb 8.1 oz (53.3 kg) Height:  6' (182.9 cm)  BEHAVIORAL SYMPTOMS/MOOD NEUROLOGICAL BOWEL NUTRITION STATUS      Incontinent Diet (minced diet)  AMBULATORY STATUS COMMUNICATION OF NEEDS Skin   Limited Assist Non-Verbally Normal                       Personal Care Assistance Level of Assistance  Bathing, Feeding, Dressing Bathing Assistance: Limited assistance Feeding assistance: Limited assistance Dressing Assistance: Limited assistance     Functional Limitations Info  Sight, Hearing, Speech Sight Info: Adequate Hearing Info:  Adequate Speech Info: Impaired (no nverbal)    SPECIAL CARE FACTORS FREQUENCY                       Contractures Contractures Info: Not present    Additional Factors Info  Code Status, Allergies Code Status Info: full code Allergies Info: no known allergies             Discharge Medications: Acetaminophen 325 MG Caps Take 650 mg by mouth daily as needed. What changed:  when to take this reasons to take this    Advair Diskus 100-50 MCG/ACT Aepb Generic drug: fluticasone-salmeterol Inhale 1 puff into the lungs 2 (two) times daily.    amantadine 100 MG capsule Commonly known as: SYMMETREL Take 100 mg by mouth 2 (two) times daily.    azelastine 0.1 % nasal spray Commonly known as: ASTELIN Place 2 sprays into both nostrils in the morning and at bedtime.    famotidine 20 MG tablet Commonly known as: PEPCID Take 20 mg by mouth daily.    fluticasone 50 MCG/ACT nasal spray Commonly known as: FLONASE Place 2 sprays into both nostrils as needed for allergies or rhinitis.    hydrOXYzine 10 MG tablet Commonly known as: ATARAX Take 10 mg by mouth 3 (three) times daily.    loratadine 10 MG tablet Commonly known as: CLARITIN Take 1 tablet by mouth daily as needed.    mirtazapine 30 MG tablet Commonly known as: REMERON Take 30 mg by mouth at bedtime.    montelukast 10 MG tablet Commonly known  as: SINGULAIR Take 10 mg by mouth daily.    OXcarbazepine 300 MG/5ML suspension Commonly known as: TRILEPTAL Take 300 mg by mouth 2 (two) times daily.    polyethylene glycol 17 g packet Commonly known as: MIRALAX / GLYCOLAX Take 17 g by mouth daily as needed. What changed:  when to take this reasons to take this    Relevant Imaging Results:  Relevant Lab Results:   Additional Information [id number]  Maree Krabbe, LCSW

## 2022-04-02 NOTE — Progress Notes (Signed)
Speech Language Pathology Treatment: Dysphagia  Patient Details Name: Mario Mason MRN: [id number] DOB: Dec 28, 1965 Today's Date: 04/02/2022 Time: 1510-1600 SLP Time Calculation (min) (ACUTE ONLY): 50 min  Assessment / Plan / Recommendation Clinical Impression  Met w/ pt and one of the caregivers at the [facility] in pt's room. Then was asked to speak w/ Butch Penny at the [facility] via TC re: pt's status. Pt was awake, engaged in self-feeding of his coffee then a Mt Dew provided by [employer]. Pt requires cues and encouragement but often determines what he wants to do, not do. He his nonverbal and communicates via gestures.   Per Caregiver, Pamala Hurry, pt has been at the [facility] for ~8 years and has been eating a Pureed diet consistency during that time. He has eaten well but on occasions when he seems to c/o a "sore throat", possibly from "allergies", pt has stopped eating. Also noted per chart, pt had a similar episode w/ weight loss, sore throat, and not eating in 2019 w/ EGD completed then. Unsure if pt has Baseline REFLUX? This could explain both a sore throat and decreased desire to eat solids d/t dysmotility/discomfort.   Per MD/Family report and chart notes, patient has eaten certain foods well -- foods w/ more texture(per Family). Chopped spaghetti was trialed during this this admit -- see tx note on 03/31/22. Pt exhibited impulsive eating behavior but no overt clinical s/s of aspiration w/ the oral intake at the dinner meal. There is risk for choking/aspiration d/t pt not chewing foods well b/f swallowing.  Suspect pt's Impulsive eating behavior could be why he was put on a Pureed diet at the [place]; [place] agreed. Unsure of the reason behind pt's weight loss in recent months; unsure d/t not liking the foods, sore throat, REFLUX. These reasons would have to be explored further by ENT and GI, and a Dietician possibly. This was discussed w/ BOTH Barbara and Donna(as they were doing prior to this admit).   A  solid foods diet cannot be recommended by ST services d/t the fact that he swallows the foods w/out chewing -- a choking hazard. ST services deferred to MD and Family to make decisions moving forward re: his diet consistency. MD reached out to pt's brother, Mario Mason, to discuss pt's FTT and option of feeding tube vs diet upgrade w/ understanding of risk. MD spoke with other Family again who reached out to patient's brother to make a decision. Per MD/chart notes:   "Addendum: 1500, 04/01/22: Able to reach patient's brother, we discussed options as above, Mario Mason has made a decision to have diet advanced. He is aware of the risk, waiting to take the risk to avoid a feeding tube.".  (of note, this Clinician does NOT suspect pt would leave a feeding tube in place which could increase risk for medical complications -- this was shared w/ Caregivers from [facility], and MD, who agreed)   Pt appears at reduced risk for aspriation when following general aspiration precautions. Recommend continue current pureed diet w/ gravies added to moisten foods; Thin liquids. Recommend general aspiration precautions; Pills Crushed in Puree; tray setup and positioning assistance for meals. REFLUX precautions d/t pt's baseline. Post MD's/Family's decision on 04/01/22, pt's diet to the Dysphagia level 2 (MINCED foods w/ added Purees) w/ comments; aspiration precautions posted in room and in chart to follow in order in hopes to lessen risk for choking/aspiration and to monitor pt w/ oral intake. NSG and Sitter to monitor pt during meals. This Clinician  provided education to both [place] and Butch Penny on the above and recommended f/u w/ pt's PCP/MD, and Family for direction moving forward. NSG/CM/MD updated and agreed. Precautions posted at bedside. No further skilled ST services indicated.          HPI HPI: Pt is a 56 year old male with history of  intellectual disability, autism, nonverbal at baseline, seizure disorder, who presents  emergency department [name] for chief concerns of syncopal events.  Patient has not been eating well for 2+ weeks refusing any medication or solid food but will sip liquid -- he gestures/communicates he has a "sore throat". The group home was familiar with him and reports that he has lost 20-30 pounds in the past 6 months. He has a severe hypernatremia at admit, malnutrition.   OF NOTE: Pt has been seen by his PCP and recommended f/u w/ ENT(see PCP note on 03/21/2022).  Per chart, pt had a similar episode w/ weight loss, sore throat, and not eating in 2019 w/ EGD completed then.   Chest Imaging: No evidence of acute cardiopulmonary abnormality; patchy density in right base suggesting atelectasis or scarring. Unsure if pt has REFLUX or not.      SLP Plan  Goals updated (diet consistency determined by Family and MD)      Recommendations for follow up therapy are one component of a multi-disciplinary discharge planning process, led by the attending physician.  Recommendations may be updated based on patient status, additional functional criteria and insurance authorization.    Recommendations  Diet recommendations: Dysphagia 2 (fine chop);Thin liquid Liquids provided via: Cup Medication Administration: Crushed with puree Supervision: Patient able to self feed;Full supervision/cueing for compensatory strategies Compensations: Minimize environmental distractions;Slow rate;Small sips/bites;Follow solids with liquid (Time b/t bites/sips - monitoring for impulsive feeding behaviors) Postural Changes and/or Swallow Maneuvers: Out of bed for meals;Seated upright 90 degrees;Upright 30-60 min after meal                General recommendations:  (Dietician f/u; ENT and GI f/u if indicated) Oral Care Recommendations: Oral care BID;Oral care before and after PO;Staff/trained caregiver to provide oral care (support) Follow Up Recommendations: No SLP follow up Assistance recommended at  discharge: Frequent or constant Supervision/Assistance (baseline) SLP Visit Diagnosis: Dysphagia, oral phase (R13.11) (impulsive; reduced awareness/insight) Plan: Goals updated (diet consistency determined by Family and MD)              Orinda Kenner, [place], [place]; [place] [phone] (ascom) Jovany Disano  04/02/2022, 4:36 PM

## 2022-04-02 NOTE — Progress Notes (Signed)
Nutrition Follow-up  DOCUMENTATION CODES:   Non-severe (moderate) malnutrition in context of chronic illness, Underweight  INTERVENTION:   -D/c Ensure Enlive po TID, each supplement provides 350 kcal and 20 grams of protein -Hormel Shake TID with meals, each supplement provides 520 kcals and 22 grams protein -Continue Magic cup TID with meals, each supplement provides 290 kcal and 9 grams of protein  -Snacks TID between meals -Continue MVI with minerals daily  NUTRITION DIAGNOSIS:   Moderate Malnutrition related to chronic illness (autism) as evidenced by mild fat depletion, mild muscle depletion, moderate muscle depletion.  Ongoing  GOAL:   Patient will meet greater than or equal to 90% of their needs  Progressing   MONITOR:   PO intake, Supplement acceptance, Diet advancement  REASON FOR ASSESSMENT:   Consult Assessment of nutrition requirement/status  ASSESSMENT:   Pt admitted from [facility] with syncope. PMH significant for learning disability, autism, nonverbal at baseline and seizure disorder.  7/4- s/p BSE- advanced to dysphagia 2 diet with thin liquids  Reviewed I/O's: +3.2 L since admission  Pt is non-verbal and unable to provide additional history. He continues to refuse Ensure supplements. Pt is consuming mostly Magic Cups, applesauce, pudding, and some bites of pureed foods. Pt family has accepted risk for aspiration and decided to advance diet to avoid feeding tube placement. Meal completion has improved since last visit; PO: 15-50%.   Medications reviewed and include pepcid, megace, and senokot.   Labs reviewed.   Diet Order:   Diet Order             DIET DYS 2 Room service appropriate? No; Fluid consistency: Thin  Diet effective now                   EDUCATION NEEDS:   No education needs have been identified at this time  Skin:  Skin Assessment: Reviewed RN Assessment  Last BM:  04/01/22 (type 4)  Height:   Ht Readings  from Last 1 Encounters:  03/26/22 6' (1.829 m)    Weight:   Wt Readings from Last 1 Encounters:  03/26/22 53.3 kg    Ideal Body Weight:  80.9 kg  BMI:  Body mass index is 15.94 kg/m.  Estimated Nutritional Needs:   Kcal:  2000-2200  Protein:  100-115g  Fluid:  >/=2L    Levada Schilling, RD, LDN, CDCES Registered Dietitian II Certified Diabetes Care and Education Specialist Please refer to [facility] for RD and/or RD on-call/weekend/after hours pager

## 2022-04-02 NOTE — Care Management Important Message (Signed)
Important Message  Patient Details  Name: Mario Mason MRN: [id number] Date of Birth: April 05, 1966   Medicare Important Message Given:  Other (see comment)  Per chart documentation it says contact person is the patient's brother, [name] "[name] [phone]). I left a voicemail asking that he call me at his convenience to review the Important Message from Medicare.  Will await a return call.   [name] A [name] 04/02/2022, 1:40 PM

## 2022-04-03 NOTE — Care Management Important Message (Signed)
Important Message  Patient Details  Name: Mario Mason MRN: [id number] Date of Birth: 02-22-1966   Medicare Important Message Given:  Yes  Late entry: Henreitta Cea [phone]) returned my call and he was in agreement with the discharge plan and thanked [place] for everything we done.   [name] A Ruchy Wildrick 04/03/2022, 9:44 AM

## 2022-07-03 ENCOUNTER — Other Ambulatory Visit: Payer: Self-pay | Admitting: Gastroenterology

## 2022-07-03 ENCOUNTER — Other Ambulatory Visit (HOSPITAL_COMMUNITY): Payer: Self-pay | Admitting: Gastroenterology

## 2022-07-03 DIAGNOSIS — R634 Abnormal weight loss: Secondary | ICD-10-CM

## 2022-07-03 DIAGNOSIS — R1310 Dysphagia, unspecified: Secondary | ICD-10-CM

## 2022-07-11 ENCOUNTER — Ambulatory Visit
Admission: RE | Admit: 2022-07-11 | Discharge: 2022-07-11 | Disposition: A | Discharge status: Home / Self Care | Payer: Medicare Other | Source: Ambulatory Visit | Attending: Gastroenterology | Admitting: Gastroenterology

## 2022-07-11 DIAGNOSIS — R1310 Dysphagia, unspecified: Secondary | ICD-10-CM | POA: Insufficient documentation

## 2022-07-16 ENCOUNTER — Other Ambulatory Visit: Payer: Self-pay | Admitting: Gastroenterology

## 2022-07-16 DIAGNOSIS — R634 Abnormal weight loss: Secondary | ICD-10-CM

## 2022-07-16 DIAGNOSIS — R933 Abnormal findings on diagnostic imaging of other parts of digestive tract: Secondary | ICD-10-CM

## 2022-07-16 DIAGNOSIS — R1310 Dysphagia, unspecified: Secondary | ICD-10-CM

## 2022-07-16 DIAGNOSIS — K219 Gastro-esophageal reflux disease without esophagitis: Secondary | ICD-10-CM

## 2022-07-18 ENCOUNTER — Ambulatory Visit
Admission: RE | Admit: 2022-07-18 | Discharge: 2022-07-18 | Disposition: A | Discharge status: Home / Self Care | Payer: Medicare Other | Source: Ambulatory Visit | Attending: Gastroenterology | Admitting: Gastroenterology

## 2022-07-18 DIAGNOSIS — R1310 Dysphagia, unspecified: Secondary | ICD-10-CM | POA: Insufficient documentation

## 2022-07-18 DIAGNOSIS — R634 Abnormal weight loss: Secondary | ICD-10-CM | POA: Insufficient documentation

## 2022-07-18 MED ORDER — IOHEXOL 300 MG/ML  SOLN
100.0000 mL | Freq: Once | INTRAMUSCULAR | Status: AC | PRN
  Administered 2022-07-18: 100 mL via INTRAVENOUS

## 2022-07-22 LAB — POCT I-STAT CREATININE: Creatinine, Ser: 1.4 mg/dL — ABNORMAL HIGH (ref 0.61–1.24)

## 2022-07-31 ENCOUNTER — Ambulatory Visit (INDEPENDENT_AMBULATORY_CARE_PROVIDER_SITE_OTHER): Payer: Medicare Other | Admitting: Urology

## 2022-07-31 VITALS — BP 165/130 | HR 71 | Ht 72.0 in | Wt 120.0 lb

## 2022-07-31 DIAGNOSIS — N400 Enlarged prostate without lower urinary tract symptoms: Secondary | ICD-10-CM

## 2022-07-31 DIAGNOSIS — N281 Cyst of kidney, acquired: Secondary | ICD-10-CM | POA: Diagnosis not present

## 2022-07-31 DIAGNOSIS — N2889 Other specified disorders of kidney and ureter: Secondary | ICD-10-CM

## 2022-07-31 DIAGNOSIS — N289 Disorder of kidney and ureter, unspecified: Secondary | ICD-10-CM

## 2022-07-31 NOTE — Progress Notes (Signed)
   07/31/22 10:23 AM   Bebe Liter September 17, 1966 [id number]  CC: Renal lesion, enlarged prostate  HPI: 56 year old male with severe autism and seizure disorder, nonverbal, here today with caregiver for the above issues.  A CT was recently performed for weight loss and dysphagia and showed an incidental finding of a 1 cm hypodense lesion at the right kidney, and prominent prostate.  On review of prior imaging from 2015 without contrast, the small renal cyst was not present.  The history is obtained entirely from his caretaker.  He has some urinary frequency and occasional urgency, but it sounds like he drinks primarily coffee and soda during the day.  No hematuria   PMH: Past Medical History:  Diagnosis Date   Constipation    Hypertension    Intellectual disability with language impairment and autistic features    Seizures ([place])     Surgical History: Past Surgical History:  Procedure Laterality Date   CATARACT EXTRACTION W/ INTRAOCULAR LENS IMPLANT  2013   both eyes done at different times   DG TEETH FULL     at [facility]   ESOPHAGOGASTRODUODENOSCOPY (EGD) WITH PROPOFOL N/A 02/03/2018   Procedure: ESOPHAGOGASTRODUODENOSCOPY (EGD) WITH PROPOFOL;  Surgeon: Toledo, Benay Pike, MD;  Location: [facility] ENDOSCOPY;  Service: Gastroenterology;  Laterality: N/A;    Social History:  reports that he has never smoked. He has never used smokeless tobacco. He reports that he does not drink alcohol and does not use drugs.  Physical Exam: BP (!) 165/130   Pulse 71   Ht 6' (1.829 m)   Wt 120 lb (54.4 kg)   BMI 16.27 kg/m    Cardiovascular: No clubbing, cyanosis, or edema. Respiratory: Normal respiratory effort, no increased work of breathing. GI: Abdomen is soft, nontender, nondistended, no abdominal masses    Pertinent Imaging: I have personally viewed and interpreted the CT showing a 30 g prostate with small median lobe, 1 cm indeterminate right exophytic renal cyst.  Assessment & Plan:    56 year old male with autism who is nonverbal and has a history of seizure disorder with incidental finding of a 1 cm hypodense cyst on the right kidney, as well as prostate measuring 30 g with small median lobe.  No significant urinary symptoms aside from urgency likely related to coffee and soda intake.  He would not be a good candidate for PSA screening with his numerous comorbidities.    Reassurance was provided regarding the small renal lesio.  He would not tolerate an MRI, and they would like to avoid this which I think is very reasonable.  I recommended follow-up in 9 months for a repeat CT, and if this small cyst is stable, could consider either renal ultrasound follow-up in the future or discontinuing surveillance.  RTC 9 months with CT prior  Nickolas Madrid, MD 07/31/2022  [facility] [address], [place], [place] [address] [phone]

## 2022-08-11 ENCOUNTER — Ambulatory Visit
Admission: RE | Admit: 2022-08-11 | Discharge: 2022-08-11 | Disposition: A | Discharge status: Home / Self Care | Payer: Medicare Other | Source: Ambulatory Visit | Attending: Gastroenterology | Admitting: Gastroenterology

## 2022-08-11 DIAGNOSIS — R933 Abnormal findings on diagnostic imaging of other parts of digestive tract: Secondary | ICD-10-CM | POA: Insufficient documentation

## 2022-08-11 DIAGNOSIS — K219 Gastro-esophageal reflux disease without esophagitis: Secondary | ICD-10-CM | POA: Insufficient documentation

## 2022-08-11 DIAGNOSIS — R1310 Dysphagia, unspecified: Secondary | ICD-10-CM | POA: Insufficient documentation

## 2022-08-11 NOTE — Progress Notes (Signed)
Modified Barium Swallow Progress Note  Patient Details  Name: Mario Mason MRN: [id number] Date of Birth: [birth year]  Today's Date: 08/11/2022  Modified Barium Swallow completed.  Full report located under Chart Review in the Imaging Section.  Brief recommendations include the following:  Clinical Impression Patient presents with moderate oral dysphagia characterized by oral holding, premature spillage, and delayed oral transfer. Patient was physically rocking in and out of the frame throughout the assessment, which was also limited by pt's refusal of some textures (accepted only thin and puree, which are his diet at baseline). No laryngeal penetration or tracheal aspiration was observed. When pt self-fed, he took rapid consecutive bites, which sometimes spilled to the valleculae before swallow initiation. Despite delayed initiation and impulsive intake, patient maintained adequate airway protection. There was complete pharyngeal and cervical esophageal clearance. His caregiver described that pt at times holds his neck or appears to swallow effortfully with solids. Suspect this is due to reduced esophageal clearance vs pharyngeal dysphagia. Discussed with caregiver esophageal precautions as well as behavioral modification strategies that may assist with slowing rate of intake (placing spoon down between bites ,alternating solids and liquids). Pt remains at mild-moderate aspiration risk given his impulsivity.    Swallow Evaluation Recommendations   Recommended Consults: Other (Comment) (continue f/u with GI)   SLP Diet Recommendations: Dysphagia 1 (Puree) solids;Thin liquid   Liquid Administration via: Cup   Medication Administration: Whole meds with puree   Supervision: Patient able to self feed;Full supervision/cueing for compensatory strategies   Compensations: Slow rate;Small sips/bites;Minimize environmental distractions;Follow solids with liquid;Other (Comment) (behavioral  intervention may assist with rate control)   Postural Changes: Remain semi-upright after after feeds/meals (Comment);Seated upright at 90 degrees   Oral Care Recommendations: Oral care BID       Rondel Baton, [place], CCC-SLP Speech-Language Pathologist [phone]  Mario Mason 08/11/2022,6:04 PM

## 2022-09-24 ENCOUNTER — Encounter: Payer: Self-pay | Admitting: Urology

## 2023-04-30 ENCOUNTER — Ambulatory Visit: Payer: Medicare Other | Admitting: Urology

## 2023-05-07 ENCOUNTER — Ambulatory Visit: Admission: RE | Admit: 2023-05-07 | Payer: Medicare Other | Source: Ambulatory Visit

## 2023-05-28 ENCOUNTER — Encounter: Payer: Self-pay | Admitting: Urology

## 2023-05-28 ENCOUNTER — Ambulatory Visit (INDEPENDENT_AMBULATORY_CARE_PROVIDER_SITE_OTHER): Payer: Medicare Other | Admitting: Urology

## 2023-05-28 VITALS — BP 128/70 | Ht 72.0 in | Wt 120.0 lb

## 2023-05-28 DIAGNOSIS — N289 Disorder of kidney and ureter, unspecified: Secondary | ICD-10-CM

## 2023-05-28 DIAGNOSIS — R399 Unspecified symptoms and signs involving the genitourinary system: Secondary | ICD-10-CM | POA: Diagnosis not present

## 2023-05-28 NOTE — Patient Instructions (Signed)
[facility] Radiology [phone] or [phone]

## 2023-05-28 NOTE — Progress Notes (Signed)
   05/28/2023 9:18 AM   Mora Appl 02-26-1966 [id number]  Reason for visit: Follow up right renal lesion, enlarged prostate, urinary symptoms  HPI: 57 year old male with severe autism and seizure disorder, nonverbal, here with caregiver for follow-up of the above issues.  History is again obtained entirely from his caregiver.  He had a CT performed in October 2023 for weight loss and dysphagia that showed an incidental finding of a 1 cm hypodense lesion on the right kidney, and a prominent prostate.  He also has mild urinary symptoms of frequency and occasional urgency, but this is related to his fluid intake of high volume of coffee and soda during the day.  No dysuria or gross hematuria.  I had ordered a follow-up CT scan with and without contrast to evaluate any change in the lesion, however this was never completed.  It sounds like there was a mixup with his group home in terms of the imaging.  I gave his caregiver the contact information for radiology to reschedule the imaging test, and we can contact her via phone with those results, and need for any further follow-up imaging.  Regarding his urinary symptoms, I again stressed the importance of minimizing bladder irritants in terms of the urinary symptoms, but per his caregiver he seems to be minimally bothered by his urination.  Prostate measured 30 g on prior CT.  Not a good candidate for PSA screening with his other comorbidities.  CT abdomen and pelvis with and without contrast for further evaluation of 1 cm right renal lesion, call with results  Sondra Come, MD  [facility] [address], [address] [place], [place] [address] [phone]

## 2023-06-23 ENCOUNTER — Ambulatory Visit
Admission: RE | Admit: 2023-06-23 | Discharge: 2023-06-23 | Disposition: A | Discharge status: Home / Self Care | Payer: Medicare Other | Source: Ambulatory Visit | Attending: Urology | Admitting: Urology

## 2023-06-23 DIAGNOSIS — N2889 Other specified disorders of kidney and ureter: Secondary | ICD-10-CM | POA: Insufficient documentation

## 2023-06-23 MED ORDER — IOHEXOL 300 MG/ML  SOLN
100.0000 mL | Freq: Once | INTRAMUSCULAR | Status: AC | PRN
  Administered 2023-06-23: 100 mL via INTRAVENOUS

## 2023-07-07 ENCOUNTER — Telehealth: Payer: Self-pay

## 2023-07-07 DIAGNOSIS — N289 Disorder of kidney and ureter, unspecified: Secondary | ICD-10-CM

## 2023-07-07 DIAGNOSIS — N2889 Other specified disorders of kidney and ureter: Secondary | ICD-10-CM

## 2023-07-07 NOTE — Telephone Encounter (Signed)
-----   Message from Sondra Come sent at 07/06/2023  4:59 PM EDT ----- Minimal change in kidney lesion from prior, recommend 1 year follow-up with CT prior  Legrand Rams, MD 07/06/2023

## 2023-07-07 NOTE — Telephone Encounter (Signed)
Called pt's legal guardian guardian, no answer. Message left. 18yr f/u scheduled. Ct ordered.

## 2024-04-14 ENCOUNTER — Encounter: Payer: Self-pay | Admitting: Urology

## 2024-07-06 ENCOUNTER — Ambulatory Visit: Admitting: Urology

## 2024-07-07 ENCOUNTER — Ambulatory Visit: Payer: Self-pay | Admitting: Urology

## 2024-08-17 ENCOUNTER — Ambulatory Visit (INDEPENDENT_AMBULATORY_CARE_PROVIDER_SITE_OTHER): Admitting: Urology

## 2024-08-17 VITALS — BP 153/94 | HR 111

## 2024-08-17 DIAGNOSIS — N289 Disorder of kidney and ureter, unspecified: Secondary | ICD-10-CM | POA: Diagnosis not present

## 2024-08-17 DIAGNOSIS — N2889 Other specified disorders of kidney and ureter: Secondary | ICD-10-CM

## 2024-08-17 NOTE — Progress Notes (Signed)
   08/17/2024 9:32 AM   Mario Mason [birth year] [id number]  Reason for visit: Follow up right renal lesion, enlarged prostate  History: Nonverbal, severe autism and seizure disorder, here with caregiver.  History obtained entirely from caregiver Initially referred in October 2023 when CT showed a incidental findings of a 1 cm exophytic lesion in the right kidney and mild prostate enlargement(30g) No significant urinary symptoms, PVR's of been normal  Physical Exam: BP (!) 153/94 (BP Location: Left Arm, Patient Position: Sitting, Cuff Size: Normal)   Pulse (!) 111   SpO2 95%   Imaging/labs: I personally viewed and interpreted the CT abdomen and pelvis with and without contrast from September 2024 showing 12 mm subtly enhancing lesion exophytic posterior midpole right kidney only minimally changed from October 2023 Imaging has not been performed this year  Today: Caregiver denies any major change in his health over the last year, no significant urinary problems, no gross hematuria  Plan:   Renal mass: With his other comorbidities active surveillance continues to be the best option, will order CT for surveillance and call with results.  If significant enlargement would consider percutaneous ablation, as would be very hesitant to consider partial nephrectomy/hospitalization with his other medical issues CT, call with results   Mario JAYSON Burnet, MD  [facility] [address], [address] [place], [place] [address] [phone]

## 2024-08-17 NOTE — Patient Instructions (Addendum)
 Please call [phone] or [phone] to schedule your imaging prior to your appointment.   You have a small 1 cm spot on your right kidney.  This has overall been relatively stable over the last few years on imaging, but needs to be monitored closely.  If this started to grow significantly or be concerning for a tumor, may need to consider other treatments in the future.  We will call with CT results to determine follow-up.  If the area is stable can continue yearly monitoring

## 2024-08-31 ENCOUNTER — Ambulatory Visit
Admission: RE | Admit: 2024-08-31 | Discharge: 2024-08-31 | Disposition: A | Source: Ambulatory Visit | Attending: Urology

## 2024-08-31 DIAGNOSIS — N2889 Other specified disorders of kidney and ureter: Secondary | ICD-10-CM | POA: Diagnosis present

## 2024-08-31 MED ORDER — IOHEXOL 300 MG/ML  SOLN
100.0000 mL | Freq: Once | INTRAMUSCULAR | Status: AC | PRN
  Administered 2024-08-31: 100 mL via INTRAVENOUS

## 2024-09-05 ENCOUNTER — Ambulatory Visit: Payer: Self-pay | Admitting: Urology

## 2024-09-05 DIAGNOSIS — N2889 Other specified disorders of kidney and ureter: Secondary | ICD-10-CM

## 2024-09-05 NOTE — Telephone Encounter (Signed)
 CT ordered.

## 2025-07-03 ENCOUNTER — Ambulatory Visit

## 2025-09-05 ENCOUNTER — Ambulatory Visit: Admitting: Urology
